# Patient Record
Sex: Female | Born: 1960 | Race: White | Hispanic: No | Marital: Single | Smoking: Never smoker
Health system: Southern US, Community
[De-identification: ages and names within clinical notes are randomized; demographics above are authoritative.]

## PROBLEM LIST (undated history)

## (undated) DIAGNOSIS — I10 Essential (primary) hypertension: Secondary | ICD-10-CM

## (undated) DIAGNOSIS — J4550 Severe persistent asthma, uncomplicated: Secondary | ICD-10-CM

## (undated) DIAGNOSIS — J309 Allergic rhinitis, unspecified: Secondary | ICD-10-CM

## (undated) DIAGNOSIS — E7800 Pure hypercholesterolemia, unspecified: Secondary | ICD-10-CM

## (undated) DIAGNOSIS — G4733 Obstructive sleep apnea (adult) (pediatric): Secondary | ICD-10-CM

## (undated) DIAGNOSIS — Z8616 Personal history of COVID-19: Secondary | ICD-10-CM

## (undated) DIAGNOSIS — J40 Bronchitis, not specified as acute or chronic: Secondary | ICD-10-CM

## (undated) DIAGNOSIS — R490 Dysphonia: Secondary | ICD-10-CM

## (undated) DIAGNOSIS — E662 Morbid (severe) obesity with alveolar hypoventilation: Secondary | ICD-10-CM

## (undated) HISTORY — DX: Obstructive sleep apnea (adult) (pediatric): G47.33

## (undated) HISTORY — DX: Morbid (severe) obesity with alveolar hypoventilation: E66.2

## (undated) HISTORY — DX: Severe persistent asthma, uncomplicated: J45.50

## (undated) HISTORY — DX: Personal history of COVID-19: Z86.16

## (undated) HISTORY — DX: Allergic rhinitis, unspecified: J30.9

## (undated) HISTORY — DX: Dysphonia: R49.0

## (undated) LAB — ECHOCARDIOGRAM COMPLETE

---

## 2020-06-02 ENCOUNTER — Other Ambulatory Visit: Payer: Self-pay

## 2020-06-02 ENCOUNTER — Emergency Department (HOSPITAL_COMMUNITY)
Admission: EM | Admit: 2020-06-02 | Discharge: 2020-06-02 | Disposition: A | Discharge status: Home / Self Care | Payer: Medicaid - Out of State | Attending: Emergency Medicine | Admitting: Emergency Medicine

## 2020-06-02 ENCOUNTER — Encounter (HOSPITAL_COMMUNITY): Payer: Self-pay | Admitting: Emergency Medicine

## 2020-06-02 ENCOUNTER — Emergency Department (HOSPITAL_COMMUNITY): Payer: Medicaid - Out of State

## 2020-06-02 DIAGNOSIS — J209 Acute bronchitis, unspecified: Secondary | ICD-10-CM | POA: Diagnosis not present

## 2020-06-02 DIAGNOSIS — Z20822 Contact with and (suspected) exposure to covid-19: Secondary | ICD-10-CM | POA: Diagnosis not present

## 2020-06-02 DIAGNOSIS — W182XXA Fall in (into) shower or empty bathtub, initial encounter: Secondary | ICD-10-CM | POA: Insufficient documentation

## 2020-06-02 DIAGNOSIS — M25512 Pain in left shoulder: Secondary | ICD-10-CM | POA: Diagnosis not present

## 2020-06-02 DIAGNOSIS — M545 Low back pain, unspecified: Secondary | ICD-10-CM

## 2020-06-02 DIAGNOSIS — W19XXXA Unspecified fall, initial encounter: Secondary | ICD-10-CM

## 2020-06-02 DIAGNOSIS — Y92012 Bathroom of single-family (private) house as the place of occurrence of the external cause: Secondary | ICD-10-CM | POA: Diagnosis not present

## 2020-06-02 DIAGNOSIS — R0602 Shortness of breath: Secondary | ICD-10-CM | POA: Diagnosis present

## 2020-06-02 HISTORY — DX: Pure hypercholesterolemia, unspecified: E78.00

## 2020-06-02 HISTORY — DX: Essential (primary) hypertension: I10

## 2020-06-02 HISTORY — DX: Bronchitis, not specified as acute or chronic: J40

## 2020-06-02 LAB — CBC
HCT: 46.9 % — ABNORMAL HIGH (ref 36.0–46.0)
Hemoglobin: 13.9 g/dL (ref 12.0–15.0)
MCH: 29.7 pg (ref 26.0–34.0)
MCHC: 29.6 g/dL — ABNORMAL LOW (ref 30.0–36.0)
MCV: 100.2 fL — ABNORMAL HIGH (ref 80.0–100.0)
Platelets: 282 10*3/uL (ref 150–400)
RBC: 4.68 MIL/uL (ref 3.87–5.11)
RDW: 14.2 % (ref 11.5–15.5)
WBC: 10.3 10*3/uL (ref 4.0–10.5)
nRBC: 0 % (ref 0.0–0.2)

## 2020-06-02 LAB — RESPIRATORY PANEL BY RT PCR (FLU A&B, COVID)
Influenza A by PCR: NEGATIVE
Influenza B by PCR: NEGATIVE
SARS Coronavirus 2 by RT PCR: NEGATIVE

## 2020-06-02 LAB — I-STAT CHEM 8, ED
BUN: 29 mg/dL — ABNORMAL HIGH (ref 6–20)
Calcium, Ion: 1.18 mmol/L (ref 1.15–1.40)
Chloride: 104 mmol/L (ref 98–111)
Creatinine, Ser: 1.2 mg/dL — ABNORMAL HIGH (ref 0.44–1.00)
Glucose, Bld: 104 mg/dL — ABNORMAL HIGH (ref 70–99)
HCT: 44 % (ref 36.0–46.0)
Hemoglobin: 15 g/dL (ref 12.0–15.0)
Potassium: 5.3 mmol/L — ABNORMAL HIGH (ref 3.5–5.1)
Sodium: 143 mmol/L (ref 135–145)
TCO2: 29 mmol/L (ref 22–32)

## 2020-06-02 LAB — BASIC METABOLIC PANEL
Anion gap: 9 (ref 5–15)
BUN: 21 mg/dL — ABNORMAL HIGH (ref 6–20)
CO2: 28 mmol/L (ref 22–32)
Calcium: 9.3 mg/dL (ref 8.9–10.3)
Chloride: 104 mmol/L (ref 98–111)
Creatinine, Ser: 1.12 mg/dL — ABNORMAL HIGH (ref 0.44–1.00)
GFR, Estimated: 56 mL/min — ABNORMAL LOW (ref >60)
Glucose, Bld: 170 mg/dL — ABNORMAL HIGH (ref 70–99)
Potassium: 5.6 mmol/L — ABNORMAL HIGH (ref 3.5–5.1)
Sodium: 141 mmol/L (ref 135–145)

## 2020-06-02 LAB — TROPONIN I (HIGH SENSITIVITY): Troponin I (High Sensitivity): 11 ng/L (ref <18)

## 2020-06-02 MED ORDER — AZITHROMYCIN 250 MG PO TABS: 250.0000 mg | ORAL_TABLET | Freq: Every day | ORAL | 0 refills | Status: DC

## 2020-06-02 MED ORDER — PREDNISONE 50 MG PO TABS: 50.0000 mg | ORAL_TABLET | Freq: Every day | ORAL | 0 refills | Status: DC

## 2020-06-02 MED ORDER — CYCLOBENZAPRINE HCL 10 MG PO TABS: 10.0000 mg | ORAL_TABLET | Freq: Three times a day (TID) | ORAL | 0 refills | Status: DC | PRN

## 2020-06-02 MED ORDER — IPRATROPIUM BROMIDE 0.02 % IN SOLN
0.5000 mg | Freq: Once | RESPIRATORY_TRACT | Status: AC
  Administered 2020-06-02: 0.5 mg via RESPIRATORY_TRACT
  Filled 2020-06-02: qty 2.5

## 2020-06-02 MED ORDER — ALBUTEROL SULFATE (2.5 MG/3ML) 0.083% IN NEBU
2.5000 mg | INHALATION_SOLUTION | Freq: Once | RESPIRATORY_TRACT | Status: AC
  Administered 2020-06-02: 2.5 mg via RESPIRATORY_TRACT
  Filled 2020-06-02: qty 3

## 2020-06-02 MED ORDER — SODIUM ZIRCONIUM CYCLOSILICATE 5 G PO PACK
5.0000 g | PACK | Freq: Once | ORAL | Status: AC
  Administered 2020-06-02: 5 g via ORAL
  Filled 2020-06-02 (×3): qty 1

## 2020-06-02 NOTE — Discharge Instructions (Signed)
You were in the emergency department for evaluation of cough increased shortness of breath along with evaluation of increased left shoulder and low back pain after a fall.  Your chest x-ray did not show any obvious pneumonia.  You did not want x-rays of your shoulder and back.  We are prescribing you some antibiotics and steroids for your breathing and some muscle relaxant for your back pain.  Please follow-up with your regular doctor.  Return to the emergency department if any worsening or concerning symptoms.

## 2020-06-02 NOTE — ED Provider Notes (Signed)
[facility] EMERGENCY DEPARTMENT Provider Note   CSN: [id number] Arrival date & time: 06/02/20  1307     History Chief Complaint  Patient presents with  . Shortness of Breath  . Fall    Julia Henderson is a 59 y.o. female.  She has a history of chronic bronchitis and she states every time she gets a cold it goes into her chest.  She had a cold and now she is having cough with green phlegm and increased shortness of breath.  No known fevers.  She is not Covid vaccinated.  She is also complaining of worsening pain in her back and left shoulder after she slipped and fell in the shower.  She has chronic shoulder and back problems and used to follow with the pain specialist though currently does not have one.  No numbness or weakness.  The history is provided by the patient.  Shortness of Breath Severity:  Moderate Onset quality:  Gradual Duration:  1 week Timing:  Constant Progression:  Worsening Chronicity:  Recurrent Context: activity   Relieved by:  Nothing Worsened by:  Activity and coughing Ineffective treatments:  None tried Associated symptoms: cough and sputum production   Associated symptoms: no abdominal pain, no chest pain, no fever, no headaches, no hemoptysis, no rash, no sore throat and no syncope   Fall Associated symptoms include shortness of breath. Pertinent negatives include no chest pain, no abdominal pain and no headaches.       Past Medical History:  Diagnosis Date  . Bronchitis   . High cholesterol   . Hypertension     There are no problems to display for this patient.   Past Surgical History:  Procedure Laterality Date  . CESAREAN SECTION       OB History   No obstetric history on file.     No family history on file.  Social History   Tobacco Use  . Smoking status: Never Smoker  . Smokeless tobacco: Never Used  Substance Use Topics  . Alcohol use: Yes  . Drug use: Not Currently    Home Medications Prior to  Admission medications   Not on File    Allergies    Patient has no allergy information on record.  Review of Systems   Review of Systems  Constitutional: Negative for fever.  HENT: Negative for sore throat.   Eyes: Negative for visual disturbance.  Respiratory: Positive for cough, sputum production and shortness of breath. Negative for hemoptysis.   Cardiovascular: Negative for chest pain and syncope.  Gastrointestinal: Negative for abdominal pain.  Genitourinary: Negative for dysuria.  Musculoskeletal: Positive for back pain.  Skin: Negative for rash.  Neurological: Negative for headaches.    Physical Exam Updated Vital Signs BP 119/75 (BP Location: Right Wrist)   Pulse (!) 108   Temp 98 F (36.7 C) (Oral)   Resp (!) 22   Ht 5\' 1"  (1.549 m)   Wt 113.4 kg   SpO2 94%   BMI 47.24 kg/m   Physical Exam Vitals and nursing note reviewed.  Constitutional:      General: She is not in acute distress.    Appearance: Normal appearance. She is well-developed.  HENT:     Head: Normocephalic and atraumatic.  Eyes:     Conjunctiva/sclera: Conjunctivae normal.  Cardiovascular:     Rate and Rhythm: Regular rhythm. Tachycardia present.     Heart sounds: No murmur heard.   Pulmonary:     Effort: Pulmonary effort  is normal. Tachypnea present. No respiratory distress.     Breath sounds: Normal breath sounds.  Abdominal:     Palpations: Abdomen is soft.     Tenderness: There is no abdominal tenderness.  Musculoskeletal:        General: Normal range of motion.     Cervical back: Neck supple.     Right lower leg: No edema.     Left lower leg: No edema.     Comments: She has tenderness anterior shoulder.  She has normal internal and external rotation.  Pain with active movement.  She has diffuse thoracic and lumbar spine tenderness.  She says that this is baseline for her may be slightly worse since the fall.  Skin:    General: Skin is warm and dry.     Capillary Refill: Capillary  refill takes less than 2 seconds.  Neurological:     General: No focal deficit present.     Mental Status: She is alert.     Gait: Gait normal.     ED Results / Procedures / Treatments   Labs (all labs ordered are listed, but only abnormal results are displayed) Labs Reviewed  CBC - Abnormal; Notable for the following components:      Result Value   HCT 46.9 (*)    MCV 100.2 (*)    MCHC 29.6 (*)    All other components within normal limits  BASIC METABOLIC PANEL - Abnormal; Notable for the following components:   Potassium 5.6 (*)    Glucose, Bld 170 (*)    BUN 21 (*)    Creatinine, Ser 1.12 (*)    GFR, Estimated 56 (*)    All other components within normal limits  I-STAT CHEM 8, ED - Abnormal; Notable for the following components:   Potassium 5.3 (*)    BUN 29 (*)    Creatinine, Ser 1.20 (*)    Glucose, Bld 104 (*)    All other components within normal limits  RESPIRATORY PANEL BY RT PCR (FLU A&B, COVID)  TROPONIN I (HIGH SENSITIVITY)    EKG EKG Interpretation  Date/Time:  Sunday June 02 2020 13:16:25 EST Ventricular Rate:  113 PR Interval:  162 QRS Duration: 68 QT Interval:  320 QTC Calculation: 438 R Axis:   94 Text Interpretation: Sinus tachycardia with frequent Premature ventricular complexes Rightward axis Possible Anterior infarct , age undetermined Abnormal ECG No old tracing to compare Confirmed by Meridee Score [id number]) on 06/02/2020 3:42:57 PM   Radiology DG Chest Portable 1 View  Result Date: 06/02/2020 CLINICAL DATA:  Shortness of breath and cough.  Chest pain. EXAM: PORTABLE CHEST 1 VIEW COMPARISON:  None. FINDINGS: Lungs are clear. Heart is enlarged with pulmonary vascularity normal. No adenopathy. No bone lesions. IMPRESSION: Lungs clear.  Cardiomegaly.  No adenopathy. Electronically Signed   By: Bretta Bang III M.D.   On: 06/02/2020 14:05    Procedures Procedures (including critical care time)  Medications Ordered in  ED Medications  albuterol (PROVENTIL) (2.5 MG/3ML) 0.083% nebulizer solution 2.5 mg (2.5 mg Nebulization Given 06/02/20 1711)  ipratropium (ATROVENT) nebulizer solution 0.5 mg (0.5 mg Nebulization Given 06/02/20 1711)  sodium zirconium cyclosilicate (LOKELMA) packet 5 g (5 g Oral Given 06/02/20 2002)    ED Course  I have reviewed the triage vital signs and the nursing notes.  Pertinent labs & imaging results that were available during my care of the patient were reviewed by me and considered in my medical decision making (see  chart for details).  Clinical Course as of Jun 03 1036  Wynelle Link Jun 02, 2020  1611 I offered the patient a lumbar spine and shoulder x-ray which she declined.  She said she did not feel like anything was broken.   [MB]    Clinical Course User Index [MB] Terrilee Files, MD   MDM Rules/Calculators/A&P                         This patient complains of cough, sob, back pain, left shoulder pain; this involves an extensive number of treatment Options and is a complaint that carries with it a high risk of complications and Morbidity. The differential includes COPD, bronchitis, pneumonia, Covid, musculoskeletal pain, fracture, dislocation  I ordered, reviewed and interpreted labs, which included CBC with normal white count normal hemoglobin, chemistries with elevated potassium, repeated and mildly elevated, Covid testing negative, troponin unremarkable I ordered medication albuterol and Atrovent neb, Lokelma for elevated potassium I ordered imaging studies which included chest x-ray and I independently    visualized and interpreted imaging which showed no acute infiltrates Previous records obtained and reviewed in epic, no recent visits  After the interventions stated above, I reevaluated the patient and found patient to be hemodynamically stable.  Still dyspneic and slightly tachycardic.  She is comfortable plan for antibiotics and steroids for her exacerbation of her  chronic bronchitis along with a muscle relaxant.  Recommend that she get a PCP for further management of her symptoms.  Return instructions discussed.   Final Clinical Impression(s) / ED Diagnoses Final diagnoses:  Acute bronchitis, unspecified organism  Fall, initial encounter  Low back pain, unspecified back pain laterality, unspecified chronicity, unspecified whether sciatica present  Left shoulder pain, unspecified chronicity    Rx / DC Orders ED Discharge Orders         Ordered    predniSONE (DELTASONE) 50 MG tablet  Daily        06/02/20 1734    azithromycin (ZITHROMAX) 250 MG tablet  Daily        06/02/20 1734    cyclobenzaprine (FLEXERIL) 10 MG tablet  3 times daily PRN        06/02/20 1734           Terrilee Files, MD 06/03/20 1042

## 2020-06-02 NOTE — ED Notes (Signed)
Called pharmacy, will send another Lokelma dose to floor.  Updated pt.

## 2020-06-02 NOTE — ED Triage Notes (Addendum)
Pt reports productive cough with green phlegm and SOB at rest.  States she has a history of bronchitis.  Also slipped and fell in shower yesterday.  Reports lower back pain and L shoulder pain.

## 2020-06-16 ENCOUNTER — Encounter (HOSPITAL_COMMUNITY): Payer: Self-pay

## 2020-06-16 ENCOUNTER — Emergency Department (HOSPITAL_COMMUNITY): Payer: Medicaid - Out of State

## 2020-06-16 ENCOUNTER — Emergency Department (HOSPITAL_COMMUNITY)
Admission: EM | Admit: 2020-06-16 | Discharge: 2020-06-16 | Disposition: A | Discharge status: Home / Self Care | Payer: Medicaid - Out of State | Attending: Emergency Medicine | Admitting: Emergency Medicine

## 2020-06-16 ENCOUNTER — Other Ambulatory Visit: Payer: Self-pay

## 2020-06-16 DIAGNOSIS — W06XXXA Fall from bed, initial encounter: Secondary | ICD-10-CM | POA: Insufficient documentation

## 2020-06-16 DIAGNOSIS — M545 Low back pain, unspecified: Secondary | ICD-10-CM | POA: Diagnosis not present

## 2020-06-16 DIAGNOSIS — I10 Essential (primary) hypertension: Secondary | ICD-10-CM | POA: Insufficient documentation

## 2020-06-16 DIAGNOSIS — M25512 Pain in left shoulder: Secondary | ICD-10-CM | POA: Diagnosis present

## 2020-06-16 DIAGNOSIS — W19XXXA Unspecified fall, initial encounter: Secondary | ICD-10-CM

## 2020-06-16 MED ORDER — HYDROCODONE-ACETAMINOPHEN 5-325 MG PO TABS
1.0000 | ORAL_TABLET | Freq: Once | ORAL | Status: AC
  Administered 2020-06-16: 1 via ORAL
  Filled 2020-06-16: qty 1

## 2020-06-16 NOTE — ED Triage Notes (Signed)
Patient states that she fell this am while getting out of bed, states that she thinks her leg gave way. Complains of left shoulder and lower back pain after hitting dresser. No loc. Alert and oriented

## 2020-06-16 NOTE — Discharge Instructions (Signed)
Please read and follow all provided instructions.  Your diagnoses today include:  1. Acute pain of left shoulder   2. Acute bilateral low back pain without sciatica   3. Fall, initial encounter     Tests performed today include:  X-rays of the affected areas  Vital signs. See below for your results today.   Home care instructions:   Follow any educational materials contained in this packet  Follow R.I.C.E. Protocol:  R - rest your injury   I  - use ice on injury without applying directly to skin  C - compress injury with bandage or splint  E - elevate the injury as much as possible  Follow-up instructions: Please follow-up with your primary care provider or the provided orthopedic physician (bone specialist) if you continue to have significant pain in 1 week. In this case you may have a more severe injury that requires further care.   Return instructions:   Please return if your fingers are numb or tingling, appear gray or blue, or you have severe pain (also elevate the arm and loosen splint or wrap if you were given one)  Please return to the Emergency Department if you experience worsening symptoms.   Please return if you have any other emergent concerns.  Additional Information:  Your vital signs today were: BP 107/62 (BP Location: Right Arm)   Pulse 86   Temp 98.2 F (36.8 C) (Oral)   SpO2 93%  If your blood pressure (BP) was elevated above 135/85 this visit, please have this repeated by your doctor within one month. --------------

## 2020-06-16 NOTE — ED Provider Notes (Signed)
4:07 PM Care of the patient assumed at the change of shift pending xrays which were negative for fracture. Patient will be discharged home, recommend rest, Motrin/APAP for pain and PCP follow up.    Pollyann Savoy, MD 06/16/20 [phone]

## 2020-06-16 NOTE — ED Provider Notes (Signed)
[facility] EMERGENCY DEPARTMENT Provider Note   CSN: [id number] Arrival date & time: 06/16/20  1325     History No chief complaint on file.   Julia Henderson is a 59 y.o. female.  Patient presents to the emergency department today for evaluation of left shoulder and lower back pain after a fall this morning.  Patient states that she fell while trying to get out of bed.  No preceding lightheadedness, chest pain or shortness of breath.  She fell against a Child psychotherapist.  She states that after she fell she was able to get up and get back in the bed.  She did not hit her head or lose consciousness.  She took gabapentin that she has at home.  Pain persisted prompting emergency department visit.  Patient denies numbness or tingling in the upper or lower extremities.  Pain in the left shoulder is worse with movement.  She is unable to reach behind her back or raise her arm above shoulder level.  Patient has a history of chronic back pain and she feels that this aggravated the pain in her lower back, which is also worse with movement.          Past Medical History:  Diagnosis Date  . Bronchitis   . High cholesterol   . Hypertension     There are no problems to display for this patient.   Past Surgical History:  Procedure Laterality Date  . CESAREAN SECTION       OB History   No obstetric history on file.     No family history on file.  Social History   Tobacco Use  . Smoking status: Never Smoker  . Smokeless tobacco: Never Used  Substance Use Topics  . Alcohol use: Yes  . Drug use: Not Currently    Home Medications Prior to Admission medications   Medication Sig Start Date End Date Taking? Authorizing Provider  azithromycin (ZITHROMAX) 250 MG tablet Take 1 tablet (250 mg total) by mouth daily. Take first 2 tablets together, then 1 every day until finished. 06/02/20   Terrilee Files, MD  cyclobenzaprine (FLEXERIL) 10 MG tablet Take 1 tablet (10 mg total)  by mouth 3 (three) times daily as needed for muscle spasms. 06/02/20   Terrilee Files, MD  predniSONE (DELTASONE) 50 MG tablet Take 1 tablet (50 mg total) by mouth daily. 06/02/20   Terrilee Files, MD    Allergies    Patient has no known allergies.  Review of Systems   Review of Systems  Constitutional: Negative for fever and unexpected weight change.  Gastrointestinal: Negative for constipation.       Negative for fecal incontinence.   Genitourinary: Negative for dysuria, flank pain and hematuria.       Negative for urinary incontinence or retention.  Musculoskeletal: Positive for arthralgias and back pain.  Neurological: Negative for weakness and numbness.       Denies saddle paresthesias.    Physical Exam Updated Vital Signs BP 107/62 (BP Location: Right Arm)   Pulse 86   Temp 98.2 F (36.8 C) (Oral)   SpO2 93%   Physical Exam Vitals and nursing note reviewed.  Constitutional:      Appearance: She is well-developed.  HENT:     Head: Normocephalic and atraumatic.  Eyes:     Conjunctiva/sclera: Conjunctivae normal.  Pulmonary:     Effort: Pulmonary effort is normal.  Abdominal:     Palpations: Abdomen is soft.  Tenderness: There is no abdominal tenderness.  Musculoskeletal:     Left shoulder: Tenderness (anterior and posterior) present. No swelling or laceration. Decreased range of motion.     Left elbow: Normal range of motion. No tenderness.     Cervical back: Normal range of motion and neck supple. No swelling or tenderness.     Lumbar back: Tenderness present. No bony tenderness.     Comments: No step-off noted with palpation of spine.   Skin:    General: Skin is warm and dry.     Findings: No rash.  Neurological:     Mental Status: She is alert.     Sensory: No sensory deficit.     Deep Tendon Reflexes: Reflexes are normal and symmetric.     Comments: 5/5 strength in entire lower extremities bilaterally. No sensation deficit.  Patient able to pivot  from wheelchair to bed without any significant difficulties.  She is able to raise her arm up to take off a purse that was around her neck.     ED Results / Procedures / Treatments   Labs (all labs ordered are listed, but only abnormal results are displayed) Labs Reviewed - No data to display  EKG None  Radiology No results found.  Procedures Procedures (including critical care time)  Medications Ordered in ED Medications  HYDROcodone-acetaminophen (NORCO/VICODIN) 5-325 MG per tablet 1 tablet (has no administration in time range)    ED Course  I have reviewed the triage vital signs and the nursing notes.  Pertinent labs & imaging results that were available during my care of the patient were reviewed by me and considered in my medical decision making (see chart for details).  Patient seen and examined. Work-up initiated. Medications ordered -- she is not driving.   Vital signs reviewed and are as follows: Vitals:   06/16/20 1334  BP: 107/62  Pulse: 86  Temp: 98.2 F (36.8 C)  SpO2: 93%   3:10 PM Signout to oncoming shift. Patient pending x-rays of lumbar spine and left shoulder.      MDM Rules/Calculators/A&P                          Pending x-rays.   Final Clinical Impression(s) / ED Diagnoses Final diagnoses:  Acute pain of left shoulder  Acute bilateral low back pain without sciatica  Fall, initial encounter    Rx / DC Orders ED Discharge Orders    None       Renne Crigler, Cordelia Poche 06/16/20 1535    Jacalyn Lefevre, MD 06/16/20 1553

## 2020-08-23 DIAGNOSIS — R0602 Shortness of breath: Secondary | ICD-10-CM

## 2020-09-13 ENCOUNTER — Emergency Department (HOSPITAL_COMMUNITY)
Admission: EM | Admit: 2020-09-13 | Discharge: 2020-09-14 | Disposition: A | Discharge status: Home / Self Care | Payer: Medicaid - Out of State | Attending: Emergency Medicine | Admitting: Emergency Medicine

## 2020-09-13 ENCOUNTER — Encounter (HOSPITAL_COMMUNITY): Payer: Self-pay | Admitting: *Deleted

## 2020-09-13 ENCOUNTER — Other Ambulatory Visit: Payer: Self-pay

## 2020-09-13 ENCOUNTER — Emergency Department (HOSPITAL_COMMUNITY): Payer: Medicaid - Out of State

## 2020-09-13 DIAGNOSIS — R000 Tachycardia, unspecified: Secondary | ICD-10-CM | POA: Insufficient documentation

## 2020-09-13 DIAGNOSIS — R609 Edema, unspecified: Secondary | ICD-10-CM | POA: Diagnosis not present

## 2020-09-13 DIAGNOSIS — Z8616 Personal history of COVID-19: Secondary | ICD-10-CM | POA: Insufficient documentation

## 2020-09-13 DIAGNOSIS — R0602 Shortness of breath: Secondary | ICD-10-CM | POA: Diagnosis not present

## 2020-09-13 DIAGNOSIS — Z8709 Personal history of other diseases of the respiratory system: Secondary | ICD-10-CM | POA: Diagnosis not present

## 2020-09-13 DIAGNOSIS — Z79899 Other long term (current) drug therapy: Secondary | ICD-10-CM | POA: Diagnosis not present

## 2020-09-13 DIAGNOSIS — I10 Essential (primary) hypertension: Secondary | ICD-10-CM | POA: Diagnosis not present

## 2020-09-13 DIAGNOSIS — R519 Headache, unspecified: Secondary | ICD-10-CM | POA: Diagnosis not present

## 2020-09-13 LAB — CBC WITH DIFFERENTIAL/PLATELET
Abs Immature Granulocytes: 0.07 10*3/uL (ref 0.00–0.07)
Basophils Absolute: 0 10*3/uL (ref 0.0–0.1)
Basophils Relative: 1 %
Eosinophils Absolute: 0.2 10*3/uL (ref 0.0–0.5)
Eosinophils Relative: 4 %
HCT: 46.2 % — ABNORMAL HIGH (ref 36.0–46.0)
Hemoglobin: 13.6 g/dL (ref 12.0–15.0)
Immature Granulocytes: 1 %
Lymphocytes Relative: 24 %
Lymphs Abs: 1.3 10*3/uL (ref 0.7–4.0)
MCH: 27.8 pg (ref 26.0–34.0)
MCHC: 29.4 g/dL — ABNORMAL LOW (ref 30.0–36.0)
MCV: 94.5 fL (ref 80.0–100.0)
Monocytes Absolute: 0.6 10*3/uL (ref 0.1–1.0)
Monocytes Relative: 11 %
Neutro Abs: 3.3 10*3/uL (ref 1.7–7.7)
Neutrophils Relative %: 59 %
Platelets: 175 10*3/uL (ref 150–400)
RBC: 4.89 MIL/uL (ref 3.87–5.11)
RDW: 14.9 % (ref 11.5–15.5)
WBC: 5.6 10*3/uL (ref 4.0–10.5)
nRBC: 0.4 % — ABNORMAL HIGH (ref 0.0–0.2)

## 2020-09-13 LAB — COMPREHENSIVE METABOLIC PANEL
ALT: 47 U/L — ABNORMAL HIGH (ref 0–44)
AST: 40 U/L (ref 15–41)
Albumin: 3.2 g/dL — ABNORMAL LOW (ref 3.5–5.0)
Alkaline Phosphatase: 69 U/L (ref 38–126)
Anion gap: 12 (ref 5–15)
BUN: 6 mg/dL — ABNORMAL LOW (ref 8–23)
CO2: 28 mmol/L (ref 22–32)
Calcium: 9.6 mg/dL (ref 8.9–10.3)
Chloride: 99 mmol/L (ref 98–111)
Creatinine, Ser: 0.87 mg/dL (ref 0.44–1.00)
GFR, Estimated: >60 mL/min (ref >60)
Glucose, Bld: 363 mg/dL — ABNORMAL HIGH (ref 70–99)
Potassium: 4.3 mmol/L (ref 3.5–5.1)
Sodium: 139 mmol/L (ref 135–145)
Total Bilirubin: 0.7 mg/dL (ref 0.3–1.2)
Total Protein: 6.6 g/dL (ref 6.5–8.1)

## 2020-09-13 LAB — D-DIMER, QUANTITATIVE: D-Dimer, Quant: 0.97 ug/mL-FEU — ABNORMAL HIGH (ref 0.00–0.50)

## 2020-09-13 MED ORDER — SODIUM CHLORIDE 0.9 % IV BOLUS: 1000.0000 mL | Freq: Once | INTRAVENOUS | Status: DC

## 2020-09-13 MED ORDER — ALBUTEROL SULFATE HFA 108 (90 BASE) MCG/ACT IN AERS
2.0000 | INHALATION_SPRAY | Freq: Once | RESPIRATORY_TRACT | Status: AC
  Administered 2020-09-13: 2 via RESPIRATORY_TRACT
  Filled 2020-09-13: qty 6.7

## 2020-09-13 MED ORDER — AEROCHAMBER PLUS FLO-VU LARGE MISC
1.0000 | Freq: Once | Status: AC
  Administered 2020-09-13: 1

## 2020-09-13 MED ORDER — METHYLPREDNISOLONE SODIUM SUCC 125 MG IJ SOLR
125.0000 mg | Freq: Once | INTRAMUSCULAR | Status: AC
  Administered 2020-09-13: 125 mg via INTRAVENOUS
  Filled 2020-09-13: qty 2

## 2020-09-13 NOTE — ED Notes (Signed)
Patient reports wearing 4L [place] at baseline, patient took O2 off and is maintaining 93-95% on room air.

## 2020-09-13 NOTE — ED Triage Notes (Signed)
Patient c/o sob onset 1 week ago states she has been coughing up green colored sputum. Patient is on home o2 at 4 liter/Julia Henderson

## 2020-09-14 MED ORDER — LISINOPRIL 20 MG PO TABS: 20.0000 mg | ORAL_TABLET | Freq: Every day | ORAL | 0 refills | Status: DC

## 2020-09-14 MED ORDER — ALBUTEROL SULFATE HFA 108 (90 BASE) MCG/ACT IN AERS: 2.0000 | INHALATION_SPRAY | RESPIRATORY_TRACT | 0 refills | Status: DC | PRN

## 2020-09-14 MED ORDER — PREDNISONE 50 MG PO TABS: 50.0000 mg | ORAL_TABLET | Freq: Every day | ORAL | 0 refills | Status: AC

## 2020-09-14 MED ORDER — PREDNISONE 50 MG PO TABS: 50.0000 mg | ORAL_TABLET | Freq: Every day | ORAL | 0 refills | Status: DC

## 2020-09-14 MED ORDER — BENZONATATE 100 MG PO CAPS: 100.0000 mg | ORAL_CAPSULE | Freq: Three times a day (TID) | ORAL | 0 refills | Status: DC | PRN

## 2020-09-14 MED ORDER — LISINOPRIL 20 MG PO TABS: 20.0000 mg | ORAL_TABLET | Freq: Once | ORAL | Status: DC

## 2020-09-14 NOTE — Discharge Instructions (Addendum)
He came to the emergency department today to be evaluated for your shortness of breath.  You were given albuterol and Solu-Medrol to help with your wheezing and shortness of breath.  There is concern that you may have a blood clot in your lungs due to your recent COVID-19 exposure and persistent fast heart rate.  The recommendation was to receive a CTA of your chest to evaluate for a blood clot.  You have refused this procedure.  If you change your mind or develop worsening shortness of breath or other concerning symptoms please return to the emergency department at any time to receive further care.  Please continue to use your albuterol inhaler every 4-6 hours as needed for shortness of breath.  I have given you a prescription of prednisone please take 1 pill once a day for the next 5 days.  I have also given you Tessalon you may use 1 pill every 8 hours as needed for cough.    These follow-up with your primary care doctor as soon as possible.  While in hospital you were noted to have a high blood pressure.  Because you are out of your medication I have given you a prescription for the next 2 weeks.  Please discuss your blood pressure management when you next see your primary care provider.  These return to the emergency department if:  Your shortness of breath gets worse. You have shortness of breath when you are resting. You feel light-headed or you faint. You have a cough that is not controlled with medicines. You cough up blood. You have pain with breathing. You have pain in your chest, arms, shoulders, or abdomen. You have a fever. You cannot walk up stairs or exercise the way that you normally do.

## 2020-09-14 NOTE — ED Provider Notes (Signed)
[facility] EMERGENCY DEPARTMENT Provider Note   CSN: [id number] Arrival date & time: 09/13/20  1323     History Chief Complaint  Patient presents with  . Shortness of Breath    Julia Henderson is a 60 y.o. female past medical history of hypertension, hyperlipidemia and bronchitis.  Patient presents with a chief complaint of shortness of breath, wheezing, and productive cough.  Patient reports he has had the symptoms for the last week and a half.  Patient reports that her symptoms have been worsening over this time.  She reports that her cough is producing green sputum.  Patient reports that her symptoms are worse with exertion, no alleviating factors.  Denies any history of asthma or COPD.  Patient endorses bilateral lower leg swelling.  Patient denies any fevers, chills, chest pain, hemoptysis, vomiting, nausea, vomiting, lightheadedness, dizziness, syncopal episodes.    Patient denies any history of DVT or PE, recent surgery, cancer treatment in the last 6 months, prolonged immobilization, hormone use, unilateral leg swelling or tenderness.  Patient reports over the last week and a half she has had remittent headaches.  These headaches are gradual in onset.  It is not maximal at onset.  No aggravating factors.    She reports that she had COVID-19 6 weeks prior, was admitted to the hospital, and was discharged on 4 L of O2 via nasal cannula.  Patient reports that she received dexamethasone in the emergency department but denies any other treatment for COVID-19.  Patient reports that she is unvaccinated for COVID-19.  HPI     Past Medical History:  Diagnosis Date  . Bronchitis   . High cholesterol   . Hypertension     There are no problems to display for this patient.   Past Surgical History:  Procedure Laterality Date  . CESAREAN SECTION       OB History   No obstetric history on file.     No family history on file.  Social History   Tobacco Use   . Smoking status: Never Smoker  . Smokeless tobacco: Never Used  Substance Use Topics  . Alcohol use: Yes  . Drug use: Not Currently    Home Medications Prior to Admission medications   Medication Sig Start Date End Date Taking? Authorizing Provider  albuterol (VENTOLIN HFA) 108 (90 Base) MCG/ACT inhaler Inhale 2 puffs into the lungs every 4 (four) hours as needed for wheezing or shortness of breath. 09/14/20   Haskel Schroeder, PA-C  azithromycin (ZITHROMAX) 250 MG tablet Take 1 tablet (250 mg total) by mouth daily. Take first 2 tablets together, then 1 every day until finished. 06/02/20   Terrilee Files, MD  benzonatate (TESSALON) 100 MG capsule Take 1 capsule (100 mg total) by mouth every 8 (eight) hours as needed for cough. 09/14/20   Haskel Schroeder, PA-C  cyclobenzaprine (FLEXERIL) 10 MG tablet Take 1 tablet (10 mg total) by mouth 3 (three) times daily as needed for muscle spasms. 06/02/20   Terrilee Files, MD  lisinopril (ZESTRIL) 20 MG tablet Take 1 tablet (20 mg total) by mouth daily for 14 days. 09/14/20 09/28/20  Haskel Schroeder, PA-C  predniSONE (DELTASONE) 50 MG tablet Take 1 tablet (50 mg total) by mouth daily for 5 days. 09/14/20 09/19/20  Haskel Schroeder, PA-C    Allergies    Patient has no known allergies.  Review of Systems   Review of Systems  Constitutional: Negative for chills and fever.  Eyes: Negative for visual disturbance.  Respiratory: Positive for cough and shortness of breath.   Cardiovascular: Positive for leg swelling. Negative for chest pain.  Gastrointestinal: Negative for abdominal pain, nausea and vomiting.  Genitourinary: Negative for dysuria.  Musculoskeletal: Negative for back pain and neck pain.  Skin: Negative for color change and rash.  Neurological: Positive for headaches. Negative for dizziness, syncope and light-headedness.  Psychiatric/Behavioral: Negative for confusion.    Physical Exam Updated Vital Signs BP (!)  125/92   Pulse (!) 124   Temp 98.4 F (36.9 C) (Oral)   Resp 19   Ht 5\' 1"  (1.549 m)   Wt 113.4 kg   SpO2 99%   BMI 47.24 kg/m   Physical Exam Vitals and nursing note reviewed.  Constitutional:      General: She is not in acute distress.    Appearance: She is not ill-appearing, toxic-appearing or diaphoretic.     Interventions: Nasal cannula in place.  HENT:     Head: Normocephalic.  Eyes:     General: No scleral icterus.       Right eye: No discharge.        Left eye: No discharge.  Cardiovascular:     Rate and Rhythm: Tachycardia present.  Pulmonary:     Effort: Pulmonary effort is normal. No tachypnea, accessory muscle usage or respiratory distress.     Breath sounds: No stridor. Examination of the right-upper field reveals wheezing. Examination of the left-upper field reveals wheezing. Examination of the right-middle field reveals wheezing. Examination of the left-middle field reveals wheezing. Examination of the right-lower field reveals wheezing. Examination of the left-lower field reveals wheezing. Wheezing present. No decreased breath sounds, rhonchi or rales.     Comments: Patient speaking in shortened sentences Musculoskeletal:     Cervical back: Neck supple.     Right lower leg: No deformity, lacerations, tenderness or bony tenderness. 1+ Edema present.     Left lower leg: No deformity, lacerations, tenderness or bony tenderness. 1+ Edema present.  Skin:    General: Skin is warm and dry.  Neurological:     General: No focal deficit present.     Mental Status: She is alert.  Psychiatric:        Behavior: Behavior is cooperative.     ED Results / Procedures / Treatments   Labs (all labs ordered are listed, but only abnormal results are displayed) Labs Reviewed  CBC WITH DIFFERENTIAL/PLATELET - Abnormal; Notable for the following components:      Result Value   HCT 46.2 (*)    MCHC 29.4 (*)    nRBC 0.4 (*)    All other components within normal limits   COMPREHENSIVE METABOLIC PANEL - Abnormal; Notable for the following components:   Glucose, Bld 363 (*)    BUN 6 (*)    Albumin 3.2 (*)    ALT 47 (*)    All other components within normal limits  D-DIMER, QUANTITATIVE - Abnormal; Notable for the following components:   D-Dimer, Quant 0.97 (*)    All other components within normal limits    EKG EKG Interpretation  Date/Time:  Friday September 13 2020 14:00:17 EST Ventricular Rate:  119 PR Interval:  158 QRS Duration: 74 QT Interval:  304 QTC Calculation: 427 R Axis:   95 Text Interpretation: Sinus tachycardia Rightward axis Cannot rule out Anterior infarct , age undetermined Abnormal ECG Sinus tachycardia, no change from previous Confirmed by 01-03-2005 [phone]) on 09/13/2020 8:38:45 PM  Radiology DG Chest 2 View  Result Date: 09/13/2020 CLINICAL DATA:  60 year old female with history of shortness of breath and productive cough for the past 2 weeks. EXAM: CHEST - 2 VIEW COMPARISON:  Chest x-ray 08/25/2020. FINDINGS: Lung volumes are normal. No consolidative airspace disease. No pleural effusions. No pneumothorax. No pulmonary nodule or mass noted. Pulmonary vasculature and the cardiomediastinal silhouette are within normal limits. IMPRESSION: No radiographic evidence of acute cardiopulmonary disease. Electronically Signed   By: Trudie Reed M.D.   On: 09/13/2020 15:01    Procedures Procedures  Medications Ordered in ED Medications  sodium chloride 0.9 % bolus 1,000 mL (1,000 mLs Intravenous Not Given 09/13/20 2359)  albuterol (VENTOLIN HFA) 108 (90 Base) MCG/ACT inhaler 2 puff (2 puffs Inhalation Given 09/13/20 2215)  methylPREDNISolone sodium succinate (SOLU-MEDROL) 125 mg/2 mL injection 125 mg (125 mg Intravenous Given 09/13/20 2259)  AeroChamber Plus Flo-Vu Large MISC 1 each (1 each Other Given 09/13/20 2358)    ED Course  I have reviewed the triage vital signs and the nursing notes.  Pertinent labs & imaging results  that were available during my care of the patient were reviewed by me and considered in my medical decision making (see chart for details).    MDM Rules/Calculators/A&P                          Alert 60 year old female no acute distress, nontoxic appearing.  Patient noted to be on 4 L of oxygen via nasal cannula.  Patient is speaking in shortened sentences.  Presents with chief complaint of shortness of breath, wheezing, productive cough for the last week and a half.  Patient reports she tested positive for COVID-19 6 weeks prior.  Patient had not received any vaccinations prior to her infection.  Patient reports that she was hospitalized, received dexamethasone, and discharged on 4 L of oxygen via nasal cannula.  Patient denies any chest pain.  Wheezing noted in all lung fields.  Patient has +1 edema to bilateral lower extremities, no erythema or tenderness noted.  Patient is noted to be tachycardic with rates from 121-109.  Oxygen saturation noted to be 99% on 4 L of oxygen via nasal cannula.  Chest x-ray shows no acute cardiopulmonary disease.  KG shows sinus tachycardia.  CMP shows glucose elevated at 363, no signs of DKA as anion gap within normal limits and bicarb within normal limits.  CBC showed hematocrit slightly elevated at 46.2.  Did patient is wheezing albuterol and Solu-Medrol were ordered.  Patient's persistent tachycardia and recent COVID-19 infection concern for pulmonary embolism.  Discussed this concern with patient.  Patient refuses CTA chest at this time despite offers of antianxiety medication.  Will order D-dimer to help risk stratify.  On repeat examination patient's lungs clear to auscultation bilaterally.  Patient is noted to have oxygen saturation 95% or greater on room air.  Patient reports that her breathing feels much better.  Discussed positive D-dimer and continued concern for pulmonary embolism.  Patient continues to refuse CTA chest.  Explained to patient that  without this test we cannot rule out the presence of a pulmonary embolism, if a pulmonary embolism is present it could lead to worsening shortness of breath and/or death.  Patient expresses understanding of this and continues to refuse CTA chest at this time.  Due to this patient will be discharged AMA.  Patient was told that she could return to the emergency department at any time if  her shortness of breath worsened or she develop any new or concerning symptoms.  Patient was given prescription for Tessalon, 5-day course of prednisone, albuterol inhaler as bronchitis is on the differential and a possible cause of her symptoms.  Patient was noted to be hypertensive throughout her hospital stay.  She reports that she is currently out of her lisinopril medication and will not receive any more until she is seen by her primary care provider on March 8.  Due to this 2-week prescription of lisinopril was written.        Final Clinical Impression(s) / ED Diagnoses Final diagnoses:  SOB (shortness of breath)    Rx / DC Orders ED Discharge Orders         Ordered    benzonatate (TESSALON) 100 MG capsule  Every 8 hours PRN,   Status:  Discontinued        09/14/20 0004    albuterol (VENTOLIN HFA) 108 (90 Base) MCG/ACT inhaler  Every 4 hours PRN,   Status:  Discontinued        09/14/20 0004    lisinopril (ZESTRIL) 20 MG tablet  Daily,   Status:  Discontinued        09/14/20 0030    albuterol (VENTOLIN HFA) 108 (90 Base) MCG/ACT inhaler  Every 4 hours PRN        09/14/20 0039    benzonatate (TESSALON) 100 MG capsule  Every 8 hours PRN        09/14/20 0039    lisinopril (ZESTRIL) 20 MG tablet  Daily        09/14/20 0039    predniSONE (DELTASONE) 50 MG tablet  Daily        09/14/20 0039    predniSONE (DELTASONE) 50 MG tablet  Daily,   Status:  Discontinued        09/14/20 0000           Haskel SchroederBadalamente, Brayn Eckstein R, PA-C 09/14/20 0107    Horton, Clabe SealKristie M, DO 09/16/20 1930

## 2020-09-24 DIAGNOSIS — G4700 Insomnia, unspecified: Secondary | ICD-10-CM

## 2020-09-24 DIAGNOSIS — U099 Post covid-19 condition, unspecified: Secondary | ICD-10-CM | POA: Insufficient documentation

## 2020-09-24 HISTORY — DX: Post covid-19 condition, unspecified: U09.9

## 2020-09-24 HISTORY — DX: Insomnia, unspecified: G47.00

## 2020-10-06 ENCOUNTER — Other Ambulatory Visit: Payer: Self-pay

## 2020-10-06 ENCOUNTER — Emergency Department (HOSPITAL_COMMUNITY): Payer: Self-pay

## 2020-10-06 ENCOUNTER — Inpatient Hospital Stay (HOSPITAL_COMMUNITY)
Admission: EM | Admit: 2020-10-06 | Discharge: 2020-10-11 | DRG: 190 | Disposition: A | Discharge status: Home / Self Care | Payer: Self-pay | Attending: Internal Medicine | Admitting: Internal Medicine

## 2020-10-06 ENCOUNTER — Encounter (HOSPITAL_COMMUNITY): Payer: Self-pay | Admitting: Emergency Medicine

## 2020-10-06 DIAGNOSIS — N390 Urinary tract infection, site not specified: Secondary | ICD-10-CM | POA: Diagnosis present

## 2020-10-06 DIAGNOSIS — Z79899 Other long term (current) drug therapy: Secondary | ICD-10-CM

## 2020-10-06 DIAGNOSIS — E1169 Type 2 diabetes mellitus with other specified complication: Secondary | ICD-10-CM

## 2020-10-06 DIAGNOSIS — E785 Hyperlipidemia, unspecified: Secondary | ICD-10-CM | POA: Diagnosis present

## 2020-10-06 DIAGNOSIS — E875 Hyperkalemia: Secondary | ICD-10-CM | POA: Diagnosis not present

## 2020-10-06 DIAGNOSIS — Z6841 Body Mass Index (BMI) 40.0 and over, adult: Secondary | ICD-10-CM

## 2020-10-06 DIAGNOSIS — J9601 Acute respiratory failure with hypoxia: Secondary | ICD-10-CM | POA: Diagnosis present

## 2020-10-06 DIAGNOSIS — J441 Chronic obstructive pulmonary disease with (acute) exacerbation: Principal | ICD-10-CM | POA: Diagnosis present

## 2020-10-06 DIAGNOSIS — Z8616 Personal history of COVID-19: Secondary | ICD-10-CM

## 2020-10-06 DIAGNOSIS — R739 Hyperglycemia, unspecified: Secondary | ICD-10-CM

## 2020-10-06 DIAGNOSIS — Z7982 Long term (current) use of aspirin: Secondary | ICD-10-CM

## 2020-10-06 DIAGNOSIS — Z9981 Dependence on supplemental oxygen: Secondary | ICD-10-CM

## 2020-10-06 DIAGNOSIS — I10 Essential (primary) hypertension: Secondary | ICD-10-CM | POA: Diagnosis present

## 2020-10-06 DIAGNOSIS — E119 Type 2 diabetes mellitus without complications: Secondary | ICD-10-CM

## 2020-10-06 DIAGNOSIS — R0602 Shortness of breath: Secondary | ICD-10-CM

## 2020-10-06 DIAGNOSIS — E1165 Type 2 diabetes mellitus with hyperglycemia: Secondary | ICD-10-CM | POA: Diagnosis present

## 2020-10-06 LAB — BASIC METABOLIC PANEL
Anion gap: 7 (ref 5–15)
BUN: 13 mg/dL (ref 8–23)
CO2: 32 mmol/L (ref 22–32)
Calcium: 9.4 mg/dL (ref 8.9–10.3)
Chloride: 98 mmol/L (ref 98–111)
Creatinine, Ser: 0.82 mg/dL (ref 0.44–1.00)
GFR, Estimated: >60 mL/min (ref >60)
Glucose, Bld: 420 mg/dL — ABNORMAL HIGH (ref 70–99)
Potassium: 4.5 mmol/L (ref 3.5–5.1)
Sodium: 137 mmol/L (ref 135–145)

## 2020-10-06 LAB — TROPONIN I (HIGH SENSITIVITY): Troponin I (High Sensitivity): 17 ng/L (ref <18)

## 2020-10-06 LAB — CBC
HCT: 42.6 % (ref 36.0–46.0)
Hemoglobin: 12.7 g/dL (ref 12.0–15.0)
MCH: 28.8 pg (ref 26.0–34.0)
MCHC: 29.8 g/dL — ABNORMAL LOW (ref 30.0–36.0)
MCV: 96.6 fL (ref 80.0–100.0)
Platelets: 223 10*3/uL (ref 150–400)
RBC: 4.41 MIL/uL (ref 3.87–5.11)
RDW: 17.9 % — ABNORMAL HIGH (ref 11.5–15.5)
WBC: 7.4 10*3/uL (ref 4.0–10.5)
nRBC: 0.3 % — ABNORMAL HIGH (ref 0.0–0.2)

## 2020-10-06 MED ORDER — SODIUM CHLORIDE 0.9 % IV BOLUS
1000.0000 mL | Freq: Once | INTRAVENOUS | Status: AC
  Administered 2020-10-07: 1000 mL via INTRAVENOUS

## 2020-10-06 MED ORDER — METOPROLOL TARTRATE 25 MG PO TABS
100.0000 mg | ORAL_TABLET | Freq: Once | ORAL | Status: AC
  Administered 2020-10-07: 100 mg via ORAL
  Filled 2020-10-06: qty 4

## 2020-10-06 MED ORDER — ALBUTEROL SULFATE (2.5 MG/3ML) 0.083% IN NEBU
INHALATION_SOLUTION | RESPIRATORY_TRACT | Status: AC
  Administered 2020-10-06: 2.5 mg via RESPIRATORY_TRACT
  Filled 2020-10-06: qty 3

## 2020-10-06 MED ORDER — ALBUTEROL SULFATE (2.5 MG/3ML) 0.083% IN NEBU
2.5000 mg | INHALATION_SOLUTION | RESPIRATORY_TRACT | Status: DC
  Filled 2020-10-06: qty 3

## 2020-10-06 NOTE — ED Notes (Signed)
Dr. Bebe Shaggy at bedside. Patient states she does not wish to do the CT scan unless her daughter can be here, her daughter can not come until the morning. Dr. Bebe Shaggy gave VO for repeat D-dimer prior to CTA

## 2020-10-06 NOTE — ED Notes (Signed)
Unsuccessful IV attempt by this RN x1

## 2020-10-06 NOTE — ED Notes (Signed)
ED Provider at bedside. 

## 2020-10-06 NOTE — ED Notes (Signed)
RT at bedside.

## 2020-10-06 NOTE — ED Notes (Signed)
Josh, RT made aware of RT orders

## 2020-10-06 NOTE — ED Provider Notes (Incomplete)
[facility] EMERGENCY DEPARTMENT Provider Note   CSN: [id number] Arrival date & time: 10/06/20  1802     History Chief Complaint  Patient presents with  . Shortness of Breath    Julia Henderson is a 60 y.o. female with a history of hypertension, hypercholesteremia, chronic bronchitis who presents to the emergency department from home with a chief complaint of shortness of breath.  The patient endorses a 3 to 4-day history of  The history is provided by the patient and medical records. No language interpreter was used.       Past Medical History:  Diagnosis Date  . Bronchitis   . High cholesterol   . Hypertension     There are no problems to display for this patient.   Past Surgical History:  Procedure Laterality Date  . CESAREAN SECTION       OB History   No obstetric history on file.     No family history on file.  Social History   Tobacco Use  . Smoking status: Never Smoker  . Smokeless tobacco: Never Used  Substance Use Topics  . Alcohol use: Yes  . Drug use: Not Currently    Home Medications Prior to Admission medications   Medication Sig Start Date End Date Taking? Authorizing Provider  albuterol (VENTOLIN HFA) 108 (90 Base) MCG/ACT inhaler Inhale 2 puffs into the lungs every 4 (four) hours as needed for wheezing or shortness of breath. 09/14/20  Yes Badalamente, Peter R, PA-C  amLODipine (NORVASC) 5 MG tablet Take 5 mg by mouth in the morning and at bedtime.   Yes [provider]  aspirin EC 81 MG tablet Take 81 mg by mouth daily. Swallow whole.   Yes [provider]  benzonatate (TESSALON) 100 MG capsule Take 1 capsule (100 mg total) by mouth every 8 (eight) hours as needed for cough. 09/14/20  Yes Haskel Schroeder, PA-C  cetirizine (ZYRTEC ALLERGY) 10 MG tablet Take 10 mg by mouth daily.   Yes [provider]  Cholecalciferol (VITAMIN D3) 125 MCG (5000 UT) TABS Take 5,000 Units by mouth daily.   Yes  [provider]  gabapentin (NEURONTIN) 300 MG capsule Take 900 mg by mouth 3 (three) times daily.   Yes [provider]  lisinopril (ZESTRIL) 20 MG tablet Take 1 tablet (20 mg total) by mouth daily for 14 days. 09/14/20 09/28/20 Yes Badalamente, Rose Phi, PA-C  metoprolol tartrate (LOPRESSOR) 100 MG tablet Take 100 mg by mouth 2 (two) times daily.   Yes [provider]  montelukast (SINGULAIR) 10 MG tablet Take 10 mg by mouth at bedtime.   Yes [provider]  Multiple Vitamin (MULTIVITAMIN) tablet Take 1 tablet by mouth daily.   Yes [provider]  Omega-3 Fatty Acids (FISH OIL) 1000 MG CAPS Take 1,000 mg by mouth daily.   Yes [provider]  pravastatin (PRAVACHOL) 40 MG tablet Take 40 mg by mouth at bedtime.   Yes [provider]  spironolactone (ALDACTONE) 25 MG tablet Take 25 mg by mouth daily.   Yes [provider]  traZODone (DESYREL) 100 MG tablet Take 100 mg by mouth at bedtime.   Yes [provider]  vitamin C (ASCORBIC ACID) 500 MG tablet Take 500 mg by mouth daily.   Yes [provider]  azithromycin (ZITHROMAX) 250 MG tablet Take 1 tablet (250 mg total) by mouth daily. Take first 2 tablets together, then 1 every day until finished. Patient not taking:  No sig reported 06/02/20   Terrilee Files, MD    Allergies    Celexa [citalopram]  Review of Systems   Review of Systems    Physical Exam Updated Vital Signs BP (!) 155/138   Pulse (!) 139   Temp 98.4 F (36.9 C)   Resp (!) 23   SpO2 99%   Physical Exam  ED Results / Procedures / Treatments   Labs (all labs ordered are listed, but only abnormal results are displayed) Labs Reviewed  BASIC METABOLIC PANEL - Abnormal; Notable for the following components:      Result Value   Glucose, Bld 420 (*)    All other components within normal limits  CBC - Abnormal; Notable for the following components:   MCHC 29.8 (*)    RDW 17.9 (*)     nRBC 0.3 (*)    All other components within normal limits  HEPATIC FUNCTION PANEL  URINALYSIS, ROUTINE W REFLEX MICROSCOPIC  I-STAT ARTERIAL BLOOD GAS, ED  TROPONIN I (HIGH SENSITIVITY)  TROPONIN I (HIGH SENSITIVITY)    EKG EKG Interpretation  Date/Time:  Sunday October 06 2020 18:52:22 EDT Ventricular Rate:  117 PR Interval:  152 QRS Duration: 76 QT Interval:  322 QTC Calculation: 449 R Axis:   103 Text Interpretation: Sinus tachycardia Rightward axis Borderline ECG Confirmed by Lorre Nick ([id number]) on 10/06/2020 9:46:54 PM   Radiology DG Chest 2 View  Result Date: 10/06/2020 CLINICAL DATA:  60 year old female with shortness of breath. EXAM: CHEST - 2 VIEW COMPARISON:  Chest radiograph dated 09/13/2020. FINDINGS: No focal consolidation, pleural effusion or pneumothorax. Top-normal cardiac size. No acute osseous pathology. IMPRESSION: No active cardiopulmonary disease. Electronically Signed   By: Elgie Collard M.D.   On: 10/06/2020 19:35    Procedures Procedures {Remember to document critical care time when appropriate:1}  Medications Ordered in ED Medications  sodium chloride 0.9 % bolus 1,000 mL (has no administration in time range)    ED Course  I have reviewed the triage vital signs and the nursing notes.  Pertinent labs & imaging results that were available during my care of the patient were reviewed by me and considered in my medical decision making (see chart for details).    MDM Rules/Calculators/A&P                          *** Final Clinical Impression(s) / ED Diagnoses Final diagnoses:  None    Rx / DC Orders ED Discharge Orders    None

## 2020-10-06 NOTE — ED Triage Notes (Signed)
Pt reports COVID + on 2/5.  Wears 4 liters O2 at home.  C/o cough with yellow and green phlegm, SOB, body aches, chills, and headache x 4 days.

## 2020-10-06 NOTE — ED Provider Notes (Addendum)
[facility] EMERGENCY DEPARTMENT Provider Note   CSN: [id number] Arrival date & time: 10/06/20  1802     History Chief Complaint  Patient presents with   Shortness of Breath    Julia Henderson is a 60 y.o. female with a history of hypertension, hypercholesteremia, chronic bronchitis who presents to the emergency department from home with a chief complaint of shortness of breath.  The patient endorses a 3 to 4-day history of worsening shortness of breath, productive cough with green and yellow sputum, nausea, body aches, chills, wheezing, and generalized headache.  The patient reports that she wears 4 L of oxygen at home as needed, which she typically only wears with exertion.  However, over the last 4 days since her symptoms have been worsening that she has been wearing oxygen almost 24 hours a day.  She is feeling short of breath both with exertion and at rest.  No chest pain, vomiting, diarrhea, abdominal pain, fever, rash, dizziness, lightheadedness, syncope, neck pain or stiffness, palpitations, leg swelling, orthopnea.  She has an albuterol inhaler with a spacer that she has been using at home.  She reports more frequent usage over the last few days.  She has used her inhaler once today, early this afternoon, with minimal improvement in her symptoms.  She reports that she has been struggling with her breathing more frequently since she was diagnosed with Covid in February.  She had a 4 to 5-day admission at [facility] in February related to COVID-19.  She was seen in the ER on February 25 for shortness of breath and was discharged home with a course of prednisone.  She reports that her symptoms improved after completing the course.  She has had no other recent corticosteroid administration.  She has no history of diabetes.  She does feel that she has been more thirsty and urinating more frequently over the last couple of months.  She is a never smoker.  However,  she does have a history of chronic bronchitis.  States the symptoms feel similar to previous flares of bronchitis.  No recent long travel.  She has been more immobilized recently secondary to her symptoms.  No recent long travel or surgery.  No personal or familial history of VTE.   The history is provided by the patient and medical records. No language interpreter was used.       Past Medical History:  Diagnosis Date   Bronchitis    High cholesterol    Hypertension     Patient Active Problem List   Diagnosis Date Noted   Acute respiratory failure with hypoxia (HCC) 10/07/2020   HTN (hypertension) 10/07/2020   Morbid obesity (HCC) 10/07/2020   DM2 (diabetes mellitus, type 2) (HCC) 10/07/2020    Past Surgical History:  Procedure Laterality Date   CESAREAN SECTION       OB History   No obstetric history on file.     Family History  Problem Relation Age of Onset   Lung disease Neg Hx     Social History   Tobacco Use   Smoking status: Never Smoker   Smokeless tobacco: Never Used  Substance Use Topics   Alcohol use: Yes   Drug use: Not Currently    Home Medications Prior to Admission medications   Medication Sig Start Date End Date Taking? Authorizing Provider  albuterol (VENTOLIN HFA) 108 (90 Base) MCG/ACT inhaler Inhale 2 puffs into the lungs every 4 (four) hours as needed for wheezing or shortness  of breath. 09/14/20  Yes Badalamente, Rose Phi, PA-C  amLODipine (NORVASC) 5 MG tablet Take 5 mg by mouth in the morning and at bedtime.   Yes [provider]  aspirin EC 81 MG tablet Take 81 mg by mouth daily. Swallow whole.   Yes [provider]  benzonatate (TESSALON) 100 MG capsule Take 1 capsule (100 mg total) by mouth every 8 (eight) hours as needed for cough. 09/14/20  Yes Haskel Schroeder, PA-C  cetirizine (ZYRTEC ALLERGY) 10 MG tablet Take 10 mg by mouth daily.   Yes [provider]  Cholecalciferol (VITAMIN D3) 125 MCG  (5000 UT) TABS Take 5,000 Units by mouth daily.   Yes [provider]  gabapentin (NEURONTIN) 300 MG capsule Take 900 mg by mouth 3 (three) times daily.   Yes [provider]  lisinopril (ZESTRIL) 20 MG tablet Take 1 tablet (20 mg total) by mouth daily for 14 days. 09/14/20 09/28/20 Yes Badalamente, Rose Phi, PA-C  metoprolol tartrate (LOPRESSOR) 100 MG tablet Take 100 mg by mouth 2 (two) times daily.   Yes [provider]  montelukast (SINGULAIR) 10 MG tablet Take 10 mg by mouth at bedtime.   Yes [provider]  Multiple Vitamin (MULTIVITAMIN) tablet Take 1 tablet by mouth daily.   Yes [provider]  Omega-3 Fatty Acids (FISH OIL) 1000 MG CAPS Take 1,000 mg by mouth daily.   Yes [provider]  pravastatin (PRAVACHOL) 40 MG tablet Take 40 mg by mouth at bedtime.   Yes [provider]  spironolactone (ALDACTONE) 25 MG tablet Take 25 mg by mouth daily.   Yes [provider]  traZODone (DESYREL) 100 MG tablet Take 100 mg by mouth at bedtime.   Yes [provider]  vitamin C (ASCORBIC ACID) 500 MG tablet Take 500 mg by mouth daily.   Yes [provider]    Allergies    Celexa [citalopram]  Review of Systems   Review of Systems  Constitutional: Positive for fatigue. Negative for activity change, chills, diaphoresis and fever.  HENT: Negative for congestion, ear discharge, ear pain, hearing loss, sinus pressure, sinus pain, sore throat and voice change.   Respiratory: Positive for cough, chest tightness, shortness of breath and wheezing. Negative for apnea and choking.   Cardiovascular: Negative for chest pain, palpitations and leg swelling.  Gastrointestinal: Negative for abdominal pain, blood in stool, constipation, diarrhea, nausea and vomiting.  Genitourinary: Negative for dysuria.  Musculoskeletal: Negative for back pain, myalgias, neck pain and neck stiffness.  Skin: Negative for rash.   Allergic/Immunologic: Negative for immunocompromised state.  Neurological: Positive for weakness and headaches. Negative for seizures, syncope, speech difficulty and numbness.  Psychiatric/Behavioral: Negative for confusion.    Physical Exam Updated Vital Signs BP 135/78 (BP Location: Left Arm)    Pulse 96    Temp 98.6 F (37 C) (Oral)    Resp 17    Wt 121.8 kg    SpO2 95%    BMI 50.73 kg/m   Physical Exam Vitals and nursing note reviewed.  Constitutional:      General: She is not in acute distress.    Appearance: She is obese. She is ill-appearing. She is not toxic-appearing or diaphoretic.  HENT:     Head: Normocephalic.     Nose: Nose normal. No congestion or rhinorrhea.     Mouth/Throat:     Mouth: Mucous membranes are moist.     Pharynx: No oropharyngeal exudate or posterior oropharyngeal erythema.  Eyes:     General: No scleral icterus.    Conjunctiva/sclera: Conjunctivae normal.  Neck:     Vascular: No carotid bruit.  Cardiovascular:     Rate and Rhythm: Normal rate and regular rhythm.     Pulses: Normal pulses.     Heart sounds: Normal heart sounds. No murmur heard. No friction rub. No gallop.   Pulmonary:     Effort: No respiratory distress.     Breath sounds: No stridor. Wheezing present. No rhonchi or rales.     Comments: Lung sounds are diminished throughout.  Faint, scattered wheezes noted in all fields.  She is tachypneic.  No retractions or accessory muscle use. Chest:     Chest wall: No tenderness.  Abdominal:     General: There is no distension.     Palpations: Abdomen is soft. There is no mass.     Tenderness: There is no abdominal tenderness. There is no right CVA tenderness, left CVA tenderness, guarding or rebound.     Hernia: No hernia is present.  Musculoskeletal:        General: No tenderness.     Cervical back: Normal range of motion and neck supple. No rigidity or tenderness.     Right lower leg: No edema.     Left lower leg: No edema.   Lymphadenopathy:     Cervical: No cervical adenopathy.  Skin:    General: Skin is warm.     Findings: No rash.  Neurological:     General: No focal deficit present.     Mental Status: She is alert.  Psychiatric:        Behavior: Behavior normal.     ED Results / Procedures / Treatments   Labs (all labs ordered are listed, but only abnormal results are displayed) Labs Reviewed  RESPIRATORY PANEL BY PCR - Abnormal; Notable for the following components:      Result Value   Rhinovirus / Enterovirus DETECTED (*)    All other components within normal limits  BASIC METABOLIC PANEL - Abnormal; Notable for the following components:   Glucose, Bld 420 (*)    All other components within normal limits  CBC - Abnormal; Notable for the following components:   MCHC 29.8 (*)    RDW 17.9 (*)    nRBC 0.3 (*)    All other components within normal limits  HEPATIC FUNCTION PANEL - Abnormal; Notable for the following components:   AST 62 (*)    All other components within normal limits  URINALYSIS, ROUTINE W REFLEX MICROSCOPIC - Abnormal; Notable for the following components:   Color, Urine AMBER (*)    APPearance CLOUDY (*)    Glucose, UA 150 (*)    Hgb urine dipstick SMALL (*)    Bilirubin Urine SMALL (*)    Ketones, ur 5 (*)    Protein, ur 100 (*)    Leukocytes,Ua MODERATE (*)    WBC, UA >50 (*)    Bacteria, UA RARE (*)    Squamous Epithelial / LPF >50 (*)    All other components within normal limits  D-DIMER, QUANTITATIVE - Abnormal; Notable for the following components:   D-Dimer, Quant 0.57 (*)    All other components within normal limits  CBC - Abnormal; Notable for the following components:   WBC 11.8 (*)    RDW 18.3 (*)    nRBC 0.3 (*)    All other components within normal limits  BASIC METABOLIC PANEL - Abnormal; Notable for  the following components:   Chloride 97 (*)    Glucose, Bld 398 (*)    Creatinine, Ser 1.22 (*)    GFR, Estimated 50 (*)    All other components  within normal limits  HEMOGLOBIN A1C - Abnormal; Notable for the following components:   Hgb A1c MFr Bld 10.3 (*)    All other components within normal limits  GLUCOSE, CAPILLARY - Abnormal; Notable for the following components:   Glucose-Capillary 439 (*)    All other components within normal limits  GLUCOSE, RANDOM - Abnormal; Notable for the following components:   Glucose, Bld 433 (*)    All other components within normal limits  I-STAT ARTERIAL BLOOD GAS, ED - Abnormal; Notable for the following components:   pCO2 arterial 50.2 (*)    Bicarbonate 29.1 (*)    Acid-Base Excess 3.0 (*)    All other components within normal limits  CBG MONITORING, ED - Abnormal; Notable for the following components:   Glucose-Capillary 203 (*)    All other components within normal limits  CBG MONITORING, ED - Abnormal; Notable for the following components:   Glucose-Capillary 494 (*)    All other components within normal limits  CBG MONITORING, ED - Abnormal; Notable for the following components:   Glucose-Capillary 464 (*)    All other components within normal limits  CBG MONITORING, ED - Abnormal; Notable for the following components:   Glucose-Capillary 485 (*)    All other components within normal limits  TROPONIN I (HIGH SENSITIVITY) - Abnormal; Notable for the following components:   Troponin I (High Sensitivity) 18 (*)    All other components within normal limits  URINE CULTURE  HIV ANTIBODY (ROUTINE TESTING W REFLEX)  CBC WITH DIFFERENTIAL/PLATELET  BASIC METABOLIC PANEL  MAGNESIUM  I-STAT ARTERIAL BLOOD GAS, ED  TROPONIN I (HIGH SENSITIVITY)    EKG EKG Interpretation  Date/Time:  Sunday October 06 2020 18:52:22 EDT Ventricular Rate:  117 PR Interval:  152 QRS Duration: 76 QT Interval:  322 QTC Calculation: 449 R Axis:   103 Text Interpretation: Sinus tachycardia Rightward axis Borderline ECG Confirmed by Lorre Nick ([id number]) on 10/06/2020 9:46:54 PM   Radiology DG Chest 2  View  Result Date: 10/06/2020 CLINICAL DATA:  60 year old female with shortness of breath. EXAM: CHEST - 2 VIEW COMPARISON:  Chest radiograph dated 09/13/2020. FINDINGS: No focal consolidation, pleural effusion or pneumothorax. Top-normal cardiac size. No acute osseous pathology. IMPRESSION: No active cardiopulmonary disease. Electronically Signed   By: Elgie Collard M.D.   On: 10/06/2020 19:35   CT ANGIO CHEST PE W OR WO CONTRAST  Result Date: 10/07/2020 CLINICAL DATA:  Shortness of breath EXAM: CT ANGIOGRAPHY CHEST WITH CONTRAST TECHNIQUE: Multidetector CT imaging of the chest was performed using the standard protocol during bolus administration of intravenous contrast. Multiplanar CT image reconstructions and MIPs were obtained to evaluate the vascular anatomy. CONTRAST:  51mL OMNIPAQUE IOHEXOL 350 MG/ML SOLN COMPARISON:  Chest radiograph October 06, 2020 FINDINGS: Cardiovascular: There is no demonstrable pulmonary embolus. There is no thoracic aortic aneurysm or dissection. There are occasional foci of calcification in visualized great vessels. There are occasional foci of coronary artery calcification. There is no pericardial effusion or pericardial thickening. Mediastinum/Nodes: Visualized thyroid appears unremarkable. No evident thoracic adenopathy. No esophageal lesions are appreciable. Lungs/Pleura: There are scattered foci of atelectatic change. No edema or airspace opacity present. No pleural effusions. No pneumothorax. Trachea and major bronchial structures appear unremarkable. Upper Abdomen: Visualized upper abdominal structures appear unremarkable.  Musculoskeletal: No blastic or lytic bone lesions. No evident chest wall lesion. Review of the MIP images confirms the above findings. IMPRESSION: 1. No demonstrable pulmonary embolus. No thoracic aortic aneurysm or dissection. There are scattered foci of coronary artery calcification. 2. Scattered areas of atelectasis. No edema or airspace opacity.  No evident pleural effusions. 3.  No evident adenopathy. Electronically Signed   By: Bretta Bang III M.D.   On: 10/07/2020 13:33    Procedures .Critical Care Performed by: Barkley Boards, PA-C Authorized by: Barkley Boards, PA-C   Critical care provider statement:    Critical care time (minutes):  40   Critical care time was exclusive of:  Separately billable procedures and treating other patients and teaching time   Critical care was necessary to treat or prevent imminent or life-threatening deterioration of the following conditions:  Respiratory failure   Critical care was time spent personally by me on the following activities:  Ordering and performing treatments and interventions, ordering and review of laboratory studies, ordering and review of radiographic studies, pulse oximetry, re-evaluation of patient's condition, obtaining history from patient or surrogate, examination of patient, evaluation of patient's response to treatment, development of treatment plan with patient or surrogate and review of old charts   I assumed direction of critical care for this patient from another provider in my specialty: no     Care discussed with: admitting provider       Medications Ordered in ED Medications  LORazepam (ATIVAN) injection 0.5-1 mg (1 mg Intravenous Given 10/07/20 1259)  cefTRIAXone (ROCEPHIN) 1 g in sodium chloride 0.9 % 100 mL IVPB (0 g Intravenous Stopped 10/07/20 0353)  mometasone-formoterol (DULERA) 200-5 MCG/ACT inhaler 2 puff (2 puffs Inhalation Not Given 10/07/20 2019)  albuterol (PROVENTIL) (2.5 MG/3ML) 0.083% nebulizer solution 2.5 mg (has no administration in time range)  umeclidinium bromide (INCRUSE ELLIPTA) 62.5 MCG/INH 1 puff (1 puff Inhalation Given 10/07/20 0913)  amLODipine (NORVASC) tablet 5 mg (5 mg Oral Given 10/07/20 2117)  benzonatate (TESSALON) capsule 100 mg (100 mg Oral Given 10/07/20 0933)  loratadine (CLARITIN) tablet 10 mg (10 mg Oral Given 10/07/20  0929)  aspirin EC tablet 81 mg (81 mg Oral Given 10/07/20 0929)  gabapentin (NEURONTIN) capsule 900 mg (900 mg Oral Given 10/07/20 2118)  cholecalciferol (VITAMIN D3) tablet 5,000 Units (5,000 Units Oral Given 10/07/20 0931)  metoprolol tartrate (LOPRESSOR) tablet 100 mg (100 mg Oral Given 10/07/20 2118)  montelukast (SINGULAIR) tablet 10 mg (10 mg Oral Given 10/07/20 2118)  multivitamin with minerals tablet 1 tablet (1 tablet Oral Given 10/07/20 0937)  omega-3 acid ethyl esters (LOVAZA) capsule 1,000 mg (1,000 mg Oral Given 10/07/20 0930)  pravastatin (PRAVACHOL) tablet 40 mg (40 mg Oral Given 10/07/20 2118)  spironolactone (ALDACTONE) tablet 25 mg (25 mg Oral Given 10/07/20 0933)  traZODone (DESYREL) tablet 100 mg (100 mg Oral Given 10/07/20 2118)  ascorbic acid (VITAMIN C) tablet 500 mg (500 mg Oral Given 10/07/20 0929)  acetaminophen (TYLENOL) tablet 650 mg (650 mg Oral Given 10/07/20 0928)  insulin aspart (novoLOG) injection 0-20 Units (20 Units Subcutaneous Given 10/07/20 1730)  insulin aspart (novoLOG) injection 0-5 Units (5 Units Subcutaneous Given 10/07/20 2119)  budesonide (PULMICORT) nebulizer solution 0.5 mg (0.5 mg Nebulization Given 10/07/20 2017)  ipratropium-albuterol (DUONEB) 0.5-2.5 (3) MG/3ML nebulizer solution 3 mL (3 mLs Nebulization Given 10/08/20 0026)  pantoprazole (PROTONIX) EC tablet 40 mg (40 mg Oral Given 10/07/20 0929)  fluticasone (FLONASE) 50 MCG/ACT nasal spray 2 spray (2 sprays Each  Nare Given 10/07/20 0912)  guaiFENesin (MUCINEX) 12 hr tablet 1,200 mg (1,200 mg Oral Given 10/07/20 2118)  insulin glargine (LANTUS) injection 15 Units (15 Units Subcutaneous Given 10/07/20 0953)  methylPREDNISolone sodium succinate (SOLU-MEDROL) 125 mg/2 mL injection 60 mg (60 mg Intravenous Given 10/07/20 2117)  insulin aspart (novoLOG) injection 4 Units (4 Units Subcutaneous Given 10/07/20 1731)  enoxaparin (LOVENOX) injection 60 mg (60 mg Subcutaneous Given 10/07/20 1726)  sodium chloride 0.9 %  bolus 1,000 mL (0 mLs Intravenous Stopped 10/07/20 0200)  metoprolol tartrate (LOPRESSOR) tablet 100 mg (100 mg Oral Given 10/07/20 0040)  methylPREDNISolone sodium succinate (SOLU-MEDROL) 125 mg/2 mL injection 125 mg (125 mg Intravenous Given 10/07/20 0107)  insulin aspart (novoLOG) injection 5 Units (5 Units Subcutaneous Given 10/07/20 0915)  iohexol (OMNIPAQUE) 350 MG/ML injection 60 mL (60 mLs Intravenous Contrast Given 10/07/20 1306)  insulin aspart (novoLOG) injection 14 Units (14 Units Subcutaneous Given 10/07/20 2119)    ED Course  I have reviewed the triage vital signs and the nursing notes.  Pertinent labs & imaging results that were available during my care of the patient were reviewed by me and considered in my medical decision making (see chart for details).    MDM Rules/Calculators/A&P                          60 year old female with a history of hypertension, hypercholesteremia, chronic bronchitis who presents the emergency department with 3 to 4-day history of worsening shortness of breath, nonproductive cough, body aches, myalgias.  Tachycardic in the 120-140s.  She is normotensive and tachypneic.  The patient was seen and independently evaluated by Dr. Bebe ShaggyWickline, attending physician.  On exam, patient's lung sounds are very diminished.  She has wheezes noted throughout.   Patient is on metoprolol and has not taken her nighttime dose.  Will administer her nighttime dose of metoprolol.  Labs and imaging have been reviewed and independently interpreted by me.  No leukocytosis.  Chest x-ray is unremarkable, despite patient's pulmonary exam.  She is noted to be hyperglycemic at 420 with a normal anion gap and bicarb.  Patient has no known history of diabetes.  She was noted to be hyperglycemic at her last ER visits, but this could be related to recent courses of corticosteroids.  We will add on urinalysis.   Patient was given multiple duonebs and given IV fluids to treat  hyperglycemia.  She has also been given Solu-Medrol.  On reevaluation, she had no improvement in her respiratory status.  She continued to have increased work of breathing.  Given her tachycardia, there was concern she may have a PE.  D-dimer was obtained and was 0.57, which is negative when age-adjusted for the patient.  I do feel that the patient would benefit from CT imaging of her chest, but patient is adamant that she does not want a CT exam until her daughter can be present, which would not be until in the morning.  Since her D-dimer is negative when age-adjusted, I am less suspicious for PE.  However, would consider CT imaging when patient is agreeable after admission.  COVID-19 test has not been ordered as patient had COVID-19 in February where she had a 4 to 5-day admission at [facility].  Given her continued work of breathing, consult to the hospitalist team and Dr. Julian ReilGardner will accept the patient for admission. The patient appears reasonably stabilized for admission considering the current resources, flow, and capabilities available in the  ED at this time, and I doubt any other [facility] requiring further screening and/or treatment in the ED prior to admission.   Final Clinical Impression(s) / ED Diagnoses Final diagnoses:  Hyperglycemia  Shortness of breath    Rx / DC Orders ED Discharge Orders    None       Barkley Boards, PA-C 10/07/20 0714    Barkley Boards, PA-C 10/08/20 0032    Zadie Rhine, MD 10/08/20 [phone]

## 2020-10-07 ENCOUNTER — Observation Stay (HOSPITAL_COMMUNITY): Payer: Self-pay

## 2020-10-07 ENCOUNTER — Encounter (HOSPITAL_COMMUNITY): Payer: Self-pay | Admitting: Internal Medicine

## 2020-10-07 DIAGNOSIS — J9601 Acute respiratory failure with hypoxia: Secondary | ICD-10-CM

## 2020-10-07 DIAGNOSIS — R739 Hyperglycemia, unspecified: Secondary | ICD-10-CM

## 2020-10-07 DIAGNOSIS — I10 Essential (primary) hypertension: Secondary | ICD-10-CM

## 2020-10-07 DIAGNOSIS — E119 Type 2 diabetes mellitus without complications: Secondary | ICD-10-CM

## 2020-10-07 DIAGNOSIS — E1169 Type 2 diabetes mellitus with other specified complication: Secondary | ICD-10-CM

## 2020-10-07 DIAGNOSIS — R0602 Shortness of breath: Secondary | ICD-10-CM

## 2020-10-07 HISTORY — DX: Type 2 diabetes mellitus without complications: E11.9

## 2020-10-07 HISTORY — DX: Essential (primary) hypertension: I10

## 2020-10-07 HISTORY — DX: Acute respiratory failure with hypoxia: J96.01

## 2020-10-07 HISTORY — DX: Morbid (severe) obesity due to excess calories: E66.01

## 2020-10-07 HISTORY — DX: Type 2 diabetes mellitus with other specified complication: E11.69

## 2020-10-07 LAB — RESPIRATORY PANEL BY PCR

## 2020-10-07 LAB — URINALYSIS, ROUTINE W REFLEX MICROSCOPIC
Glucose, UA: 150 mg/dL — AB
Ketones, ur: 5 mg/dL — AB
Nitrite: NEGATIVE
Protein, ur: 100 mg/dL — AB
Specific Gravity, Urine: 1.028 (ref 1.005–1.030)
Squamous Epithelial / HPF: >50 — ABNORMAL HIGH (ref 0–5)
WBC, UA: >50 WBC/hpf — ABNORMAL HIGH (ref 0–5)
pH: 5 (ref 5.0–8.0)

## 2020-10-07 LAB — BASIC METABOLIC PANEL
Anion gap: 12 (ref 5–15)
BUN: 16 mg/dL (ref 8–23)
CO2: 26 mmol/L (ref 22–32)
Calcium: 9.1 mg/dL (ref 8.9–10.3)
Chloride: 97 mmol/L — ABNORMAL LOW (ref 98–111)
Creatinine, Ser: 1.22 mg/dL — ABNORMAL HIGH (ref 0.44–1.00)
GFR, Estimated: 50 mL/min — ABNORMAL LOW (ref >60)
Glucose, Bld: 398 mg/dL — ABNORMAL HIGH (ref 70–99)
Potassium: 4.5 mmol/L (ref 3.5–5.1)
Sodium: 135 mmol/L (ref 135–145)

## 2020-10-07 LAB — I-STAT ARTERIAL BLOOD GAS, ED
Acid-Base Excess: 3 mmol/L — ABNORMAL HIGH (ref 0.0–2.0)
Bicarbonate: 29.1 mmol/L — ABNORMAL HIGH (ref 20.0–28.0)
Calcium, Ion: 1.2 mmol/L (ref 1.15–1.40)
HCT: 42 % (ref 36.0–46.0)
Hemoglobin: 14.3 g/dL (ref 12.0–15.0)
O2 Saturation: 96 %
Patient temperature: 98.4
Potassium: 3.8 mmol/L (ref 3.5–5.1)
Sodium: 138 mmol/L (ref 135–145)
TCO2: 31 mmol/L (ref 22–32)
pCO2 arterial: 50.2 mmHg — ABNORMAL HIGH (ref 32.0–48.0)
pH, Arterial: 7.37 (ref 7.350–7.450)
pO2, Arterial: 85 mmHg (ref 83.0–108.0)

## 2020-10-07 LAB — CBC
HCT: 44.3 % (ref 36.0–46.0)
Hemoglobin: 13.4 g/dL (ref 12.0–15.0)
MCH: 29.3 pg (ref 26.0–34.0)
MCHC: 30.2 g/dL (ref 30.0–36.0)
MCV: 96.7 fL (ref 80.0–100.0)
Platelets: 247 10*3/uL (ref 150–400)
RBC: 4.58 MIL/uL (ref 3.87–5.11)
RDW: 18.3 % — ABNORMAL HIGH (ref 11.5–15.5)
WBC: 11.8 10*3/uL — ABNORMAL HIGH (ref 4.0–10.5)
nRBC: 0.3 % — ABNORMAL HIGH (ref 0.0–0.2)

## 2020-10-07 LAB — HEPATIC FUNCTION PANEL
ALT: 43 U/L (ref 0–44)
AST: 62 U/L — ABNORMAL HIGH (ref 15–41)
Albumin: 3.7 g/dL (ref 3.5–5.0)
Alkaline Phosphatase: 58 U/L (ref 38–126)
Bilirubin, Direct: 0.2 mg/dL (ref 0.0–0.2)
Indirect Bilirubin: 0.4 mg/dL (ref 0.3–0.9)
Total Bilirubin: 0.6 mg/dL (ref 0.3–1.2)
Total Protein: 7.7 g/dL (ref 6.5–8.1)

## 2020-10-07 LAB — TROPONIN I (HIGH SENSITIVITY): Troponin I (High Sensitivity): 18 ng/L — ABNORMAL HIGH (ref <18)

## 2020-10-07 LAB — D-DIMER, QUANTITATIVE: D-Dimer, Quant: 0.57 ug/mL-FEU — ABNORMAL HIGH (ref 0.00–0.50)

## 2020-10-07 LAB — GLUCOSE, RANDOM: Glucose, Bld: 433 mg/dL — ABNORMAL HIGH (ref 70–99)

## 2020-10-07 LAB — HEMOGLOBIN A1C
Hgb A1c MFr Bld: 10.3 % — ABNORMAL HIGH (ref 4.8–5.6)
Mean Plasma Glucose: 248.91 mg/dL

## 2020-10-07 LAB — CBG MONITORING, ED
Glucose-Capillary: 203 mg/dL — ABNORMAL HIGH (ref 70–99)
Glucose-Capillary: 494 mg/dL — ABNORMAL HIGH (ref 70–99)
Glucose-Capillary: 464 mg/dL — ABNORMAL HIGH (ref 70–99)
Glucose-Capillary: 485 mg/dL — ABNORMAL HIGH (ref 70–99)

## 2020-10-07 LAB — GLUCOSE, CAPILLARY: Glucose-Capillary: 439 mg/dL — ABNORMAL HIGH (ref 70–99)

## 2020-10-07 LAB — HIV ANTIBODY (ROUTINE TESTING W REFLEX): HIV Screen 4th Generation wRfx: NONREACTIVE

## 2020-10-07 MED ORDER — INSULIN ASPART 100 UNIT/ML ~~LOC~~ SOLN
4.0000 [IU] | Freq: Three times a day (TID) | SUBCUTANEOUS | Status: DC
  Administered 2020-10-07: 4 [IU] via SUBCUTANEOUS

## 2020-10-07 MED ORDER — METHYLPREDNISOLONE SODIUM SUCC 125 MG IJ SOLR
125.0000 mg | Freq: Once | INTRAMUSCULAR | Status: AC
  Administered 2020-10-07: 125 mg via INTRAVENOUS
  Filled 2020-10-07: qty 2

## 2020-10-07 MED ORDER — METOPROLOL TARTRATE 100 MG PO TABS
100.0000 mg | ORAL_TABLET | Freq: Two times a day (BID) | ORAL | Status: DC
  Administered 2020-10-07 – 2020-10-11 (×9): 100 mg via ORAL
  Filled 2020-10-07: qty 4
  Filled 2020-10-07 (×8): qty 1

## 2020-10-07 MED ORDER — BUDESONIDE 0.5 MG/2ML IN SUSP
0.5000 mg | Freq: Two times a day (BID) | RESPIRATORY_TRACT | Status: DC
  Administered 2020-10-07 – 2020-10-11 (×8): 0.5 mg via RESPIRATORY_TRACT
  Filled 2020-10-07 (×11): qty 2

## 2020-10-07 MED ORDER — ALBUTEROL SULFATE HFA 108 (90 BASE) MCG/ACT IN AERS: 2.0000 | INHALATION_SPRAY | RESPIRATORY_TRACT | Status: DC | PRN

## 2020-10-07 MED ORDER — OMEGA-3-ACID ETHYL ESTERS 1 G PO CAPS
1000.0000 mg | ORAL_CAPSULE | Freq: Every day | ORAL | Status: DC
  Administered 2020-10-07 – 2020-10-11 (×5): 1000 mg via ORAL
  Filled 2020-10-07 (×6): qty 1

## 2020-10-07 MED ORDER — ENOXAPARIN SODIUM 40 MG/0.4ML ~~LOC~~ SOLN: 40.0000 mg | SUBCUTANEOUS | Status: DC

## 2020-10-07 MED ORDER — ENOXAPARIN SODIUM 60 MG/0.6ML ~~LOC~~ SOLN
60.0000 mg | SUBCUTANEOUS | Status: DC
  Administered 2020-10-07 – 2020-10-10 (×4): 60 mg via SUBCUTANEOUS
  Filled 2020-10-07 (×4): qty 0.6

## 2020-10-07 MED ORDER — METHYLPREDNISOLONE SODIUM SUCC 125 MG IJ SOLR: 80.0000 mg | Freq: Three times a day (TID) | INTRAMUSCULAR | Status: DC

## 2020-10-07 MED ORDER — METHYLPREDNISOLONE SODIUM SUCC 125 MG IJ SOLR
60.0000 mg | Freq: Three times a day (TID) | INTRAMUSCULAR | Status: DC
  Administered 2020-10-07 – 2020-10-08 (×3): 60 mg via INTRAVENOUS
  Filled 2020-10-07 (×3): qty 2

## 2020-10-07 MED ORDER — ASPIRIN EC 81 MG PO TBEC
81.0000 mg | DELAYED_RELEASE_TABLET | Freq: Every day | ORAL | Status: DC
  Administered 2020-10-07 – 2020-10-11 (×5): 81 mg via ORAL
  Filled 2020-10-07 (×5): qty 1

## 2020-10-07 MED ORDER — GUAIFENESIN ER 600 MG PO TB12: 1200.0000 mg | ORAL_TABLET | Freq: Two times a day (BID) | ORAL | Status: DC

## 2020-10-07 MED ORDER — INSULIN ASPART 100 UNIT/ML ~~LOC~~ SOLN
0.0000 [IU] | Freq: Three times a day (TID) | SUBCUTANEOUS | Status: DC
  Administered 2020-10-07: 15 [IU] via SUBCUTANEOUS

## 2020-10-07 MED ORDER — MONTELUKAST SODIUM 10 MG PO TABS
10.0000 mg | ORAL_TABLET | Freq: Every day | ORAL | Status: DC
  Administered 2020-10-07 – 2020-10-10 (×4): 10 mg via ORAL
  Filled 2020-10-07 (×5): qty 1

## 2020-10-07 MED ORDER — INSULIN ASPART 100 UNIT/ML ~~LOC~~ SOLN
0.0000 [IU] | Freq: Every day | SUBCUTANEOUS | Status: DC
  Administered 2020-10-07 – 2020-10-09 (×3): 5 [IU] via SUBCUTANEOUS
  Administered 2020-10-10: 2 [IU] via SUBCUTANEOUS

## 2020-10-07 MED ORDER — ASCORBIC ACID 500 MG PO TABS
500.0000 mg | ORAL_TABLET | Freq: Every day | ORAL | Status: DC
  Administered 2020-10-07 – 2020-10-11 (×5): 500 mg via ORAL
  Filled 2020-10-07 (×5): qty 1

## 2020-10-07 MED ORDER — ALBUTEROL SULFATE (2.5 MG/3ML) 0.083% IN NEBU: 2.5000 mg | INHALATION_SOLUTION | RESPIRATORY_TRACT | Status: DC | PRN

## 2020-10-07 MED ORDER — ACETAMINOPHEN 325 MG PO TABS
650.0000 mg | ORAL_TABLET | ORAL | Status: DC | PRN
  Administered 2020-10-07 – 2020-10-11 (×2): 650 mg via ORAL
  Filled 2020-10-07 (×2): qty 2

## 2020-10-07 MED ORDER — ALBUTEROL SULFATE (2.5 MG/3ML) 0.083% IN NEBU
2.5000 mg | INHALATION_SOLUTION | RESPIRATORY_TRACT | Status: DC | PRN
  Administered 2020-10-07 (×2): 2.5 mg via RESPIRATORY_TRACT

## 2020-10-07 MED ORDER — GABAPENTIN 300 MG PO CAPS
900.0000 mg | ORAL_CAPSULE | Freq: Three times a day (TID) | ORAL | Status: DC
Start: 2020-10-07 — End: 2020-10-11
  Administered 2020-10-07 – 2020-10-10 (×10): 900 mg via ORAL
  Administered 2020-10-10: 600 mg via ORAL
  Administered 2020-10-10: 900 mg via ORAL
  Administered 2020-10-10: 300 mg via ORAL
  Administered 2020-10-11: 900 mg via ORAL
  Filled 2020-10-07 (×14): qty 3

## 2020-10-07 MED ORDER — SPIRONOLACTONE 25 MG PO TABS
25.0000 mg | ORAL_TABLET | Freq: Every day | ORAL | Status: DC
  Administered 2020-10-07: 25 mg via ORAL
  Filled 2020-10-07 (×2): qty 1

## 2020-10-07 MED ORDER — PANTOPRAZOLE SODIUM 40 MG PO TBEC
40.0000 mg | DELAYED_RELEASE_TABLET | Freq: Every day | ORAL | Status: DC
  Administered 2020-10-07 – 2020-10-11 (×5): 40 mg via ORAL
  Filled 2020-10-07 (×5): qty 1

## 2020-10-07 MED ORDER — BENZONATATE 100 MG PO CAPS
100.0000 mg | ORAL_CAPSULE | Freq: Three times a day (TID) | ORAL | Status: DC | PRN
  Administered 2020-10-07 – 2020-10-10 (×6): 100 mg via ORAL
  Filled 2020-10-07 (×6): qty 1

## 2020-10-07 MED ORDER — IOHEXOL 350 MG/ML SOLN
60.0000 mL | Freq: Once | INTRAVENOUS | Status: AC | PRN
  Administered 2020-10-07: 60 mL via INTRAVENOUS

## 2020-10-07 MED ORDER — UMECLIDINIUM BROMIDE 62.5 MCG/INH IN AEPB
1.0000 | INHALATION_SPRAY | Freq: Every day | RESPIRATORY_TRACT | Status: DC
  Administered 2020-10-07 – 2020-10-11 (×2): 1 via RESPIRATORY_TRACT
  Filled 2020-10-07 (×2): qty 7

## 2020-10-07 MED ORDER — VITAMIN D 25 MCG (1000 UNIT) PO TABS
5000.0000 [IU] | ORAL_TABLET | Freq: Every day | ORAL | Status: DC
  Administered 2020-10-07 – 2020-10-11 (×5): 5000 [IU] via ORAL
  Filled 2020-10-07 (×5): qty 5

## 2020-10-07 MED ORDER — ADULT MULTIVITAMIN W/MINERALS CH
1.0000 | ORAL_TABLET | Freq: Every day | ORAL | Status: DC
  Administered 2020-10-07 – 2020-10-11 (×5): 1 via ORAL
  Filled 2020-10-07 (×5): qty 1

## 2020-10-07 MED ORDER — ENOXAPARIN SODIUM 120 MG/0.8ML ~~LOC~~ SOLN
120.0000 mg | Freq: Two times a day (BID) | SUBCUTANEOUS | Status: DC
  Filled 2020-10-07 (×2): qty 0.8

## 2020-10-07 MED ORDER — LORAZEPAM 2 MG/ML IJ SOLN
0.5000 mg | INTRAMUSCULAR | Status: DC | PRN
Start: 2020-10-07 — End: 2020-10-11
  Administered 2020-10-07: 1 mg via INTRAVENOUS
  Filled 2020-10-07: qty 1

## 2020-10-07 MED ORDER — INSULIN ASPART 100 UNIT/ML ~~LOC~~ SOLN
14.0000 [IU] | Freq: Once | SUBCUTANEOUS | Status: AC
  Administered 2020-10-07: 14 [IU] via SUBCUTANEOUS

## 2020-10-07 MED ORDER — LORATADINE 10 MG PO TABS
10.0000 mg | ORAL_TABLET | Freq: Every day | ORAL | Status: DC
  Administered 2020-10-07 – 2020-10-11 (×5): 10 mg via ORAL
  Filled 2020-10-07 (×5): qty 1

## 2020-10-07 MED ORDER — SODIUM CHLORIDE 0.9 % IV SOLN
1.0000 g | Freq: Every day | INTRAVENOUS | Status: DC
  Administered 2020-10-07 – 2020-10-10 (×4): 1 g via INTRAVENOUS
  Filled 2020-10-07 (×4): qty 10

## 2020-10-07 MED ORDER — IPRATROPIUM-ALBUTEROL 0.5-2.5 (3) MG/3ML IN SOLN
3.0000 mL | Freq: Four times a day (QID) | RESPIRATORY_TRACT | Status: DC
  Administered 2020-10-07 – 2020-10-09 (×7): 3 mL via RESPIRATORY_TRACT
  Filled 2020-10-07 (×9): qty 3

## 2020-10-07 MED ORDER — INSULIN ASPART 100 UNIT/ML ~~LOC~~ SOLN
0.0000 [IU] | Freq: Three times a day (TID) | SUBCUTANEOUS | Status: DC
  Administered 2020-10-07 (×2): 20 [IU] via SUBCUTANEOUS
  Administered 2020-10-08: 4 [IU] via SUBCUTANEOUS
  Administered 2020-10-08: 15 [IU] via SUBCUTANEOUS
  Administered 2020-10-08 – 2020-10-09 (×2): 20 [IU] via SUBCUTANEOUS
  Administered 2020-10-09: 15 [IU] via SUBCUTANEOUS
  Administered 2020-10-09: 20 [IU] via SUBCUTANEOUS
  Administered 2020-10-10: 3 [IU] via SUBCUTANEOUS
  Administered 2020-10-10: 7 [IU] via SUBCUTANEOUS
  Administered 2020-10-10: 11 [IU] via SUBCUTANEOUS
  Administered 2020-10-11: 4 [IU] via SUBCUTANEOUS
  Administered 2020-10-11: 7 [IU] via SUBCUTANEOUS

## 2020-10-07 MED ORDER — INSULIN GLARGINE 100 UNIT/ML ~~LOC~~ SOLN
15.0000 [IU] | Freq: Every day | SUBCUTANEOUS | Status: DC
  Administered 2020-10-07: 15 [IU] via SUBCUTANEOUS
  Filled 2020-10-07 (×2): qty 0.15

## 2020-10-07 MED ORDER — INSULIN GLARGINE 100 UNIT/ML ~~LOC~~ SOLN
10.0000 [IU] | Freq: Every day | SUBCUTANEOUS | Status: DC
Start: 2020-10-07 — End: 2020-10-07

## 2020-10-07 MED ORDER — AMLODIPINE BESYLATE 5 MG PO TABS
5.0000 mg | ORAL_TABLET | Freq: Two times a day (BID) | ORAL | Status: DC
  Administered 2020-10-07 – 2020-10-10 (×8): 5 mg via ORAL
  Filled 2020-10-07 (×8): qty 1

## 2020-10-07 MED ORDER — PRAVASTATIN SODIUM 40 MG PO TABS
40.0000 mg | ORAL_TABLET | Freq: Every day | ORAL | Status: DC
  Administered 2020-10-07 – 2020-10-10 (×4): 40 mg via ORAL
  Filled 2020-10-07 (×4): qty 1

## 2020-10-07 MED ORDER — GUAIFENESIN ER 600 MG PO TB12
1200.0000 mg | ORAL_TABLET | Freq: Two times a day (BID) | ORAL | Status: DC
  Administered 2020-10-07 – 2020-10-11 (×9): 1200 mg via ORAL
  Filled 2020-10-07 (×9): qty 2

## 2020-10-07 MED ORDER — INSULIN ASPART 100 UNIT/ML ~~LOC~~ SOLN: 0.0000 [IU] | Freq: Every day | SUBCUTANEOUS | Status: DC

## 2020-10-07 MED ORDER — PREDNISONE 20 MG PO TABS
40.0000 mg | ORAL_TABLET | Freq: Every day | ORAL | Status: DC
  Administered 2020-10-07: 40 mg via ORAL
  Filled 2020-10-07: qty 2

## 2020-10-07 MED ORDER — TRAZODONE HCL 50 MG PO TABS
100.0000 mg | ORAL_TABLET | Freq: Every day | ORAL | Status: DC
  Administered 2020-10-07 – 2020-10-10 (×4): 100 mg via ORAL
  Filled 2020-10-07 (×4): qty 2

## 2020-10-07 MED ORDER — FLUTICASONE PROPIONATE 50 MCG/ACT NA SUSP
2.0000 | Freq: Every day | NASAL | Status: DC
  Administered 2020-10-07: 2 via NASAL
  Filled 2020-10-07: qty 16

## 2020-10-07 MED ORDER — INSULIN ASPART 100 UNIT/ML ~~LOC~~ SOLN
5.0000 [IU] | Freq: Once | SUBCUTANEOUS | Status: AC
  Administered 2020-10-07: 5 [IU] via SUBCUTANEOUS

## 2020-10-07 MED ORDER — MOMETASONE FURO-FORMOTEROL FUM 200-5 MCG/ACT IN AERO
2.0000 | INHALATION_SPRAY | Freq: Two times a day (BID) | RESPIRATORY_TRACT | Status: DC
  Administered 2020-10-07: 2 via RESPIRATORY_TRACT
  Filled 2020-10-07: qty 8.8

## 2020-10-07 NOTE — ED Notes (Addendum)
Message sent to MD Janee Morn. "Good morning! Are you covering this patient? Her CBG was 494 this morning. Gave 15 units of insulin, but she is off the scale and may need another few units. (She is on prednisone.) Please advise."  Patient in chair in room, not wanting to lay in stretcher. She walked to the bathroom with a steady gait. Back in room and on monitors.

## 2020-10-07 NOTE — H&P (Signed)
History and Physical    Julia Henderson QIO:[id number] DOB: 27-Nov-1960 DOA: 10/06/2020  PCP: Patient, No Pcp Per  Patient coming from: Home  I have personally briefly reviewed patient's old medical records in [facility] Link  Chief Complaint: SOB, cough  HPI: Julia Henderson is a 60 y.o. female with medical history significant of COPD / chronic bronchitis, HTN, HLD, morbid obesity.  Pt admitted to [facility] on 2/5 with COVID-19.  Since that time she has had SOB, wearing up to 4L PRN at home.  For the past 4 days she has had cough, chills, body aches, headache.  Cough productive of yellow sputum.  She has had SOB during that time that has progressively worsened, now severe.  No abd pain, N/V/D, rash, syncope, orthopnea, leg swelling.  Does have new onset polyuria / polydipsia.  Not previously ever diagnosed with DM.   ED Course: Severe wheezing, improved some with neb treatments and steroids in ED.  BGL on BMP is 420.  (was in the 300s during ED visit last month too).  PT sating 100% on 4L, but remains wheezy and SOB.  D.Dimer 0.57.  Pt refused CT scan until daughter can be here in AM due to anxiety.  Review of Systems: As per HPI, otherwise all review of systems negative.  Past Medical History:  Diagnosis Date  . Bronchitis   . High cholesterol   . Hypertension     Past Surgical History:  Procedure Laterality Date  . CESAREAN SECTION       reports that she has never smoked. She has never used smokeless tobacco. She reports current alcohol use. She reports previous drug use.  Allergies  Allergen Reactions  . Celexa [Citalopram]     Family History  Problem Relation Age of Onset  . Lung disease Neg Hx      Prior to Admission medications   Medication Sig Start Date End Date Taking? Authorizing Provider  albuterol (VENTOLIN HFA) 108 (90 Base) MCG/ACT inhaler Inhale 2 puffs into the lungs every 4 (four) hours as needed for wheezing or shortness of breath. 09/14/20  Yes  Badalamente, Peter R, PA-C  amLODipine (NORVASC) 5 MG tablet Take 5 mg by mouth in the morning and at bedtime.   Yes [provider]  aspirin EC 81 MG tablet Take 81 mg by mouth daily. Swallow whole.   Yes [provider]  benzonatate (TESSALON) 100 MG capsule Take 1 capsule (100 mg total) by mouth every 8 (eight) hours as needed for cough. 09/14/20  Yes Haskel Schroeder, PA-C  cetirizine (ZYRTEC ALLERGY) 10 MG tablet Take 10 mg by mouth daily.   Yes [provider]  Cholecalciferol (VITAMIN D3) 125 MCG (5000 UT) TABS Take 5,000 Units by mouth daily.   Yes [provider]  gabapentin (NEURONTIN) 300 MG capsule Take 900 mg by mouth 3 (three) times daily.   Yes [provider]  lisinopril (ZESTRIL) 20 MG tablet Take 1 tablet (20 mg total) by mouth daily for 14 days. 09/14/20 09/28/20 Yes Badalamente, Rose Phi, PA-C  metoprolol tartrate (LOPRESSOR) 100 MG tablet Take 100 mg by mouth 2 (two) times daily.   Yes [provider]  montelukast (SINGULAIR) 10 MG tablet Take 10 mg by mouth at bedtime.   Yes [provider]  Multiple Vitamin (MULTIVITAMIN) tablet Take 1 tablet by mouth daily.   Yes [provider]  Omega-3 Fatty Acids (FISH OIL) 1000 MG CAPS Take 1,000 mg by mouth daily.   Yes  [provider]  pravastatin (PRAVACHOL) 40 MG tablet Take 40 mg by mouth at bedtime.   Yes [provider]  spironolactone (ALDACTONE) 25 MG tablet Take 25 mg by mouth daily.   Yes [provider]  traZODone (DESYREL) 100 MG tablet Take 100 mg by mouth at bedtime.   Yes [provider]  vitamin C (ASCORBIC ACID) 500 MG tablet Take 500 mg by mouth daily.   Yes [provider]    Physical Exam: Vitals:   10/07/20 0040 10/07/20 0100 10/07/20 0115 10/07/20 0200  BP:  (!) 126/93 111/67 (!) 124/102  Pulse: (!) 131 (!) 123 (!) 116 89  Resp:  17 20 14   Temp:      SpO2:  99% 100% 100%    Constitutional:  NAD, calm, comfortable Eyes: PERRL, lids and conjunctivae normal ENMT: Mucous membranes are moist. Posterior pharynx clear of any exudate or lesions.Normal dentition.  Neck: normal, supple, no masses, no thyromegaly Respiratory: diffuse wheezing Cardiovascular: Regular rate and rhythm, no murmurs / rubs / gallops. No extremity edema. 2+ pedal pulses. No carotid bruits.  Abdomen: no tenderness, no masses palpated. No hepatosplenomegaly. Bowel sounds positive.  Musculoskeletal: no clubbing / cyanosis. No joint deformity upper and lower extremities. Good ROM, no contractures. Normal muscle tone.  Skin: no rashes, lesions, ulcers. No induration Neurologic: CN 2-12 grossly intact. Sensation intact, DTR normal. Strength 5/5 in all 4.  Psychiatric: Normal judgment and insight. Alert and oriented x 3. Normal mood.    Labs on Admission: I have personally reviewed following labs and imaging studies  CBC: Recent Labs  Lab 10/06/20 1852 10/07/20 0207  WBC 7.4  --   HGB 12.7 14.3  HCT 42.6 42.0  MCV 96.6  --   PLT 223  --    Basic Metabolic Panel: Recent Labs  Lab 10/06/20 1852 10/07/20 0207  NA 137 138  K 4.5 3.8  CL 98  --   CO2 32  --   GLUCOSE 420*  --   BUN 13  --   CREATININE 0.82  --   CALCIUM 9.4  --    GFR: CrCl cannot be calculated (Unknown ideal weight.). Liver Function Tests: Recent Labs  Lab 10/07/20 0023  AST 62*  ALT 43  ALKPHOS 58  BILITOT 0.6  PROT 7.7  ALBUMIN 3.7   No results for input(s): LIPASE, AMYLASE in the last 168 hours. No results for input(s): AMMONIA in the last 168 hours. Coagulation Profile: No results for input(s): INR, PROTIME in the last 168 hours. Cardiac Enzymes: No results for input(s): CKTOTAL, CKMB, CKMBINDEX, TROPONINI in the last 168 hours. BNP (last 3 results) No results for input(s): PROBNP in the last 8760 hours. HbA1C: No results for input(s): HGBA1C in the last 72 hours. CBG: Recent Labs  Lab 10/07/20 0233  GLUCAP  203*   Lipid Profile: No results for input(s): CHOL, HDL, LDLCALC, TRIG, CHOLHDL, LDLDIRECT in the last 72 hours. Thyroid Function Tests: No results for input(s): TSH, T4TOTAL, FREET4, T3FREE, THYROIDAB in the last 72 hours. Anemia Panel: No results for input(s): VITAMINB12, FOLATE, FERRITIN, TIBC, IRON, RETICCTPCT in the last 72 hours. Urine analysis: No results found for: COLORURINE, APPEARANCEUR, LABSPEC, PHURINE, GLUCOSEU, HGBUR, BILIRUBINUR, KETONESUR, PROTEINUR, UROBILINOGEN, NITRITE, LEUKOCYTESUR  Radiological Exams on Admission: DG Chest 2 View  Result Date: 10/06/2020 CLINICAL DATA:  60 year old female with shortness of breath. EXAM: CHEST - 2 VIEW COMPARISON:  Chest radiograph dated 09/13/2020. FINDINGS: No focal consolidation, pleural effusion or pneumothorax.  Top-normal cardiac size. No acute osseous pathology. IMPRESSION: No active cardiopulmonary disease. Electronically Signed   By: Elgie Collard M.D.   On: 10/06/2020 19:35    EKG: Independently reviewed.  Assessment/Plan Principal Problem:   Acute respiratory failure with hypoxia (HCC) Active Problems:   HTN (hypertension)   Morbid obesity (HCC)   DM2 (diabetes mellitus, type 2) (HCC)    1. Acute respiratory failure with hypoxia - 1. DDx Bronchitis / COPD exacerbation vs PE vs post COVID pulmonary fibrosis 2. Pt refuses CT chest until her daughter can come to hospital and be with her in AM 3. In the mean time will treat empirically as bronchitis / COPD exacerbation 4. COPD pathway 5. Tele 6. PRN albuterol 7. Scheduled LABA/LAMA/inh steroid 8. Prednisone 9. Adult wheeze 10. Cont pulse ox 11. PH neutral, no ACUTE hypercapnea on ABG 2. HTN - 1. Cont home BP meds 3. DM2 - new diagnosis 1. Mod scale SSI AC/HS 2. Diabetes coordinator consult 3. Steroids being used for #1 wont help this any  DVT prophylaxis: Lovenox Code Status: Full Family Communication: No family in room Disposition Plan: Home after  breathing improved Consults called: None Admission status: Place in obs   Kseniya Grunden M. DO [facility]  How to contact the [facility] Attending or Consulting provider 7A - 7P or covering provider during after hours 7P -7A, for this patient?  1. Check the care team in [facility] and look for a) attending/consulting [facility] provider listed and b) the [facility] team listed 2. Log into [email or web address]  Amion Physician Scheduling and messaging for groups and whole hospitals  On call and physician scheduling software for group practices, residents, hospitalists and other medical providers for call, clinic, rotation and shift schedules. OnCall Enterprise is a hospital-wide system for scheduling doctors and paging doctors on call. EasyPlot is for scientific plotting and data analysis.  [email or web address]  and use [place]'s universal password to access. If you do not have the password, please contact the hospital operator.  3. Locate the [facility] provider you are looking for under [facility] and page to a number that you can be directly reached. 4. If you still have difficulty reaching the provider, please page the [facility] (Director on Call) for the Hospitalists listed on amion for assistance.  10/07/2020, 2:37 AM

## 2020-10-07 NOTE — ED Notes (Signed)
Offered pt hospital bed while waiting in the ED, pt refused

## 2020-10-07 NOTE — Discharge Instructions (Signed)
Glucose Products:  ReliOnT glucose products raise low blood sugar fast. Tablets are free of fat, caffeine, sodium and gluten. They are portable and easy to carry, making it easier for people with diabetes to BE PREPARED for lows. Glucose Tablets Available in 6 flavors . 10 ct...................................... $1.00 . 50 ct...................................... $3.98 Glucose Shot..................................$1.48 Glucose Gel....................................$3.44  Alcohol Swabs Alcohol swabs are used to sterilize your injection site. All of our swabs are individually wrapped for maximum safety, convenience and moisture retention. ReliOnT Alcohol Swabs . 100 ct Swabs..............................$1.00 . 400 ct Swabs..............................$3.74  Lancets ReliOnT offers three lancet options conveniently designed to work with almost every lancing device. Each features a protective disk, which guarantees sterility before testing. ReliOnT Lancets . 100 ct Lancets $1.56 . 200 ct Lancets $2.64 Available in Ultra-Thin, Thin & Micro-Thin ReliOnT 2-IN-1 Lancing Device . 50 ct Lancets..................................... $3.44 Available in 30 gauge and 25 gauge ReliOnT Lancing Device....................$5.84  Blood Glucose Monitors ReliOnT offers a full range of blood glucose testing options to provide an accurate, affordable system that meets each person's unique needs and preferences. Prime Meter....................................... $9.00 Prime Test Strips . 25 test strips.................................... $5.00 . 50 test strips.................................... $9.00 . 100 test strips.................................$17.88 Premier BLU Meter  ............  $18.98 Premier Voice Meter  .............  $14.98 Premier Test Strips . 50 test strips.................................... $9.00 . 100 test strips.................................$17.88 Premier [employer]   ............  $19.44 Kit includes: . 50 test strips . 10 lancets . Lancing device . Carry case  Ketone Test Strips . 50 test strips  ................  $6.64  Human Insulin  Novolin/ReliOnT (recombinant DNA origin) is manufactured for [employer] by [employer] Insulin* with Vial..........$24.88 Available in N, R, 70/30 Novolin/ReliOnT Insulin Pens*  .....  $42.88 Available in 70/30 only  Insulin Delivery ReliOnT syringes and pen needles provide precision technology, comfort and accuracy in insulin delivery at affordable prices. ReliOnT Pen Needles* . 50 ct....................................................$9.00 Available in 260m, 666m 60m57m 36m84mliOnT Insulin Syringes* . 100 ct ............ $12.58 Available in 29G, 30G & 31G (3/10cc, 1/2cc & 1cc units)

## 2020-10-07 NOTE — ED Notes (Signed)
ED Provider at bedside. 

## 2020-10-07 NOTE — ED Notes (Signed)
Called lab, they are to send viral swab to ED for resp. Panel.

## 2020-10-07 NOTE — ED Notes (Signed)
IV team at bedside 

## 2020-10-07 NOTE — ED Notes (Signed)
CT called and Rn assessed patient's willingness to get CTA. Patient offered her dose of ativan and higher flow of O2. Patient refusing at this time and verbalizes she understands the risks of delaying this test. Will let CT know

## 2020-10-07 NOTE — ED Notes (Addendum)
Patient refused CT scan, McDonald, PA made aware of same. RT at bedside attempting ABG collection.

## 2020-10-07 NOTE — Evaluation (Signed)
Physical Therapy Evaluation Patient Details Name: Julia Henderson MRN: [id number] DOB: [birth year] Today's Date: 10/07/2020   History of Present Illness  60 yo female with onset of acute respiratory failure on chronic home O2 was brought to ED.  Pt is SOB even for baseline, new polyuria, coughing and has HA with productive cough.  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  PMHx:  bronchitis, HTN, COPD, Covid,  Clinical Impression  Pt was seen for mobility on her bedside where pt is sitting in a chair.  Her SOB and need for supplemental O2 preceded this visit but is worsened now with desaturation with only short trips on walker.  Follow up with her to monitor these findings, as her pregait sats were 96%, then down to 84% after walk and finally 95% with rest.  Continue on with her mobility and focus on need for lighter assistive device, as rollator recommended to support her efforts and give pt a place to sit down during longer walks.  See for goals of acute PT.    Follow Up Recommendations Home health PT;Supervision for mobility/OOB    Equipment Recommendations  Other (comment) (Rollator walker)    Recommendations for Other Services       Precautions / Restrictions Precautions Precautions: Fall Precaution Comments: monitor O2 sats Restrictions Weight Bearing Restrictions: No Other Position/Activity Restrictions: standing postures desat quickly      Mobility  Bed Mobility               General bed mobility comments: up in chair when PT arrived    Transfers Overall transfer level: Needs assistance Equipment used: 1 person hand held assist Transfers: Sit to/from Stand Sit to Stand: Supervision;Min guard         General transfer comment: min guard for safety and security  Ambulation/Gait Ambulation/Gait assistance: Min guard Gait Distance (Feet): 12 Feet Assistive device: 1 person hand held assist Gait Pattern/deviations: Step-to  pattern;Step-through pattern;Decreased stride length;Wide base of support Gait velocity: reduced Gait velocity interpretation: <1.31 ft/sec, indicative of household ambulator General Gait Details: short trip in room with 4L O2, desaturated to 89% quickly  Stairs            Wheelchair Mobility    Modified Rankin (Stroke Patients Only)       Balance Overall balance assessment: Needs assistance Sitting-balance support: Feet supported Sitting balance-Leahy Scale: Good     Standing balance support: No upper extremity supported Standing balance-Leahy Scale: Fair                               Pertinent Vitals/Pain Pain Assessment: No/denies pain    Home Living Family/patient expects to be discharged to:: Private residence Living Arrangements: Alone;Children Available Help at Discharge: Family;Available 24 hours/day Type of Home: House Home Access: Stairs to enter Entrance Stairs-Rails: Right Entrance Stairs-Number of Steps: 2 Home Layout: One level Home Equipment: Walker - 2 wheels;Cane - single point Additional Comments: may benefit from Rollator to sit and rest, transport O2    Prior Function Level of Independence: Independent with assistive device(s)         Comments: walked with O2 and alone     Hand Dominance   Dominant Hand: Right    Extremity/Trunk Assessment   Upper Extremity Assessment Upper Extremity Assessment: Generalized weakness    Lower Extremity Assessment Lower Extremity Assessment: Generalized weakness    Cervical / Trunk Assessment Cervical /  Trunk Assessment: Kyphotic (mild)  Communication   Communication: No difficulties  Cognition Arousal/Alertness: Awake/alert Behavior During Therapy: WFL for tasks assessed/performed Overall Cognitive Status: Within Functional Limits for tasks assessed                                 General Comments: pt is problem solving with PT about home      General  Comments General comments (skin integrity, edema, etc.): pt was able to balance wiht no AD but feels weak, better with UE support and recommending rollator    Exercises     Assessment/Plan    PT Assessment Patient needs continued PT services  PT Problem List Decreased strength;Decreased balance;Decreased mobility;Cardiopulmonary status limiting activity       PT Treatment Interventions DME instruction;Gait training;Stair training;Functional mobility training;Therapeutic activities;Therapeutic exercise;Balance training;Neuromuscular re-education;Patient/family education    PT Goals (Current goals can be found in the Care Plan section)  Acute Rehab PT Goals Patient Stated Goal: to walk and get stronger again PT Goal Formulation: With patient/family Time For Goal Achievement: 10/21/20 Potential to Achieve Goals: Good    Frequency Min 3X/week   Barriers to discharge Decreased caregiver support home with family most of the time, stairs to enter    Co-evaluation               AM-PAC PT "6 Clicks" Mobility  Outcome Measure Help needed turning from your back to your side while in a flat bed without using bedrails?: A Little Help needed moving from lying on your back to sitting on the side of a flat bed without using bedrails?: A Little Help needed moving to and from a bed to a chair (including a wheelchair)?: A Little Help needed standing up from a chair using your arms (e.g., wheelchair or bedside chair)?: A Little Help needed to walk in hospital room?: A Little Help needed climbing 3-5 steps with a railing? : A Lot 6 Click Score: 17    End of Session Equipment Utilized During Treatment: Oxygen Activity Tolerance: Patient limited by fatigue;Treatment limited secondary to medical complications (Comment) Patient left: in chair;with call bell/phone within reach;with family/visitor present Nurse Communication: Mobility status PT Visit Diagnosis: Unsteadiness on feet  (R26.81);Dizziness and giddiness (R42);Muscle weakness (generalized) (M62.81)    Time: 5361-4431 PT Time Calculation (min) (ACUTE ONLY): 30 min   Charges:   PT Evaluation $PT Eval Moderate Complexity: 1 Mod PT Treatments $Gait Training: 8-22 mins       Ivar Drape 10/07/2020, 3:41 PM  Samul Dada, PT MS Acute Rehab Dept. Number: [facility] [id number] and [facility] [phone]

## 2020-10-07 NOTE — ED Notes (Signed)
Patient continues to refuse CT scan until the morning when her daughter is here despite multiple providers discussing importance of same.

## 2020-10-07 NOTE — ED Notes (Signed)
Hospitalist at bedside 

## 2020-10-07 NOTE — ED Notes (Signed)
Patient provided with bagged meal and diet soda per her request. Feeding self without issue at this time.

## 2020-10-07 NOTE — Progress Notes (Addendum)
Inpatient Diabetes Program Recommendations  AACE/ADA: New Consensus Statement on Inpatient Glycemic Control (2015)  Target Ranges:  Prepandial:   less than 140 mg/dL      Peak postprandial:   less than 180 mg/dL (1-2 hours)      Critically ill patients:  140 - 180 mg/dL   Results for Julia Henderson, Julia Henderson (MRN [id number]) as of 10/07/2020 06:53  Ref. Range 10/06/2020 18:52  Sodium Latest Ref Range: 135 - 145 mmol/L 137  Potassium Latest Ref Range: 3.5 - 5.1 mmol/L 4.5  Chloride Latest Ref Range: 98 - 111 mmol/L 98  CO2 Latest Ref Range: 22 - 32 mmol/L 32  Glucose Latest Ref Range: 70 - 99 mg/dL 420 (H)  BUN Latest Ref Range: 8 - 23 mg/dL 13  Creatinine Latest Ref Range: 0.44 - 1.00 mg/dL 0.82  Calcium Latest Ref Range: 8.9 - 10.3 mg/dL 9.4  Anion gap Latest Ref Range: 5 - 15  7   Results for Julia Henderson, Julia Henderson (MRN [id number]) as of 10/07/2020 06:53  Ref. Range 10/07/2020 02:33  Glucose-Capillary Latest Ref Range: 70 - 99 mg/dL 203 (H)  125 mg Solumedrol _0 :07am   Results for Julia Henderson, Julia Henderson (MRN [id number]) as of 10/07/2020 06:53  Ref. Range 10/07/2020 02:28  Hemoglobin A1C Latest Ref Range: 4.8 - 5.6 % 10.3 (H)  (248 mg/dl)    Admit with: Acute respiratory failure with hypoxia/ DM2 - new diagnosis COPD exacerbation vs PE vs post COVID pulmonary fibrosis  History: COPD  Current Orders: Novolog Moderate Correction Scale/ SSI (0-15 units) TID AC + HS   Prednisone 40 mg Daily  New Diagnosis of Diabetes--No PCP listed--No Insurance listed--Plan to consult TOC team  Novolog SSi to start at [place] today  Will order educational materials and plan to speak with pt about new diagnosis   Addendum 11:50am--Met w/ pt down in the ED to discuss admission glucose, Current A1c, New diabetes diagnosis.  Pt was concerned about her glucose--she stated to me that all the steroids she has received over the past month has caused issues with her glucose levels and that she doesn't want to be labeled as a  diabetic until her glucose levels have been re-evaluated after being off the steroids for several weeks.  Pt told me she went on disability and stopped working as a Therapist, sports about 2 years ago.  Gained about 55 pounds after stopping her job.  Moved to  last August (2021) and just last week got established with PCP here in [place] last week.  Told me her PCP did not say anything about her glucose levels at last week's visit and cannot remember what her last A1c level was.  Pt told me she had COVID in early February and was given IV Steroids in the hospital (she thinks Solumedrol) and then was given Decadron to take daily for 5 days after discharge.  Came back to [facility] ED about 3 weeks after she was discharged from [facility] and was given Solumedrol again and sent home with Prednisone 50 mg Daily.  Pt again stated to me that she wants to be off the steroids for several weeks before her glucose levels are re-evaluated and then if they are elevated she will accept a diabetes diagnosis.  Discussed with pt that given her older age, obesity, recent steroid use, and COVID diagnosis that she has several major risk factors at play that can lead to diabetes.  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  Knows how to check her CBGs and asked me if she should start checking CBGs at home.  I strongly encouraged pt to go to [facility] and purchase a CBG meter OTC and begin checking CBGs at least TID [facility] (an occasionally after meals as well).  We reviewed normal/goal CBGs for home, normal/goal A1c.  Gave pt 2 educational pamphlets on diabetes basics and better diabetes nutrition/meal planning for home.  Plan to order Living Well with diabetes book for pt when she gets a room assignment.  Strongly encouraged pt to follow up with her PCP after discharge and to make sure she has her A1c retested about 2-3 months after her last dose of steroids at home.  Currently pt  aware of her glucose levels and willing to take insulin in hospital and open to info about hyperglycemia, however, currently is not willing to accept a diagnosis of diabetes at this time.    --Will follow patient during hospitalization--  Wyn Quaker RN, MSN, CDE Diabetes Coordinator Inpatient Glycemic Control Team Team Pager: [phone] (8a-5p)

## 2020-10-07 NOTE — Plan of Care (Signed)

## 2020-10-07 NOTE — Progress Notes (Signed)
Two RT's attempted ABG multiple times and was unsuccessful with obtaining sample. MD will be notified.

## 2020-10-07 NOTE — Progress Notes (Addendum)
PROGRESS NOTE    Julia Henderson  QQV:[id number] DOB: 16-Aug-1960 DOA: 10/06/2020 PCP: Patient, No Pcp Per    Chief Complaint  Patient presents with  . Shortness of Breath    Brief Narrative:  Patient 60 year old morbidly obese female history of COPD/chronic bronchitis, hypertension, hyperlipidemia, recent hospitalization 08/24/2020 at [facility] for COVID-19 infection noted at time with shortness of breath and discharged home on 4 L home O2 as needed. Patient presented to the ED with a 4-day history of productive cough of greenish sputum, body aches, chills, headache, worsening shortness of breath which has progressed. On presentation to the ED patient noted with severe wheezing with some improvement with nebulizer treatments and steroids.  Blood glucose level noted to be 420.  D-dimer elevated at 0.57. CT angiogram chest ordered however patient refused until daughter could be here due to anxiety. Patient admitted for acute COPD exacerbation versus post Covid pulmonary fibrosis versus PE.Marland Kitchen   Assessment & Plan:   Principal Problem:   Acute respiratory failure with hypoxia (HCC) Active Problems:   HTN (hypertension)   Morbid obesity (HCC)   DM2 (diabetes mellitus, type 2) (HCC)  #1 acute respiratory failure with hypoxia Differential includes acute COPD exacerbation versus PE versus post Covid pulmonary fibrosis. -D-dimer elevated, CT angiogram chest ordered however patient refused until daughter able to come to the hospital to be with her prior to obtaining CT. -Patient with some clinical improvement. - Place on Pulmicort nebs twice daily, scheduled duo nebs, Claritin, Flonase, PPI. -DC oral prednisone and placed on IV Solu-Medrol 60 mg every 8 hours with taper. -Continue IV Rocephin. -Patient on full dose Lovenox pending CT angiogram results and if negative will change back to prophylactic dose Lovenox. -Supportive care.  2.  Hypertension Continue home regimen Norvasc 5 mg  daily.  3.  Newly diagnosed diabetes mellitus type 2 -Patient states does not have a history of diabetes. -Hemoglobin A1c 10.3 (10/07/2020). -Patient with elevated blood glucose level of 494 this morning. -Placed on Lantus 15 units daily -Change sliding scale insulin to resistant scale insulin. -Placed on NovoLog 4 units 3 times daily meal coverage. -Consult with diabetic coordinator.  4.  UTI -Urinalysis with moderate leukocytes, nitrite negative, WBC > 50 -Check a urine culture -On IV Rocephin.  5.  Morbid obesity    DVT prophylaxis: Lovenox Code Status: Full Family Communication: Updated patient.  No family at bedside. Disposition:   Status is: Inpatient    Dispo: The patient is from: Home              Anticipated d/c is to: Home              Patient currently on IV antibiotics, IV steroids, nebulizer treatments, CT angiogram chest pending.  Not stable for discharge.   Difficult to place patient now       Consultants:   None  Procedures:  CT angiogram chest pending 10/07/2020  Chest x-ray 10/06/2020    Antimicrobials:   IV Rocephin 10/07/2020>>>>   Subjective: Patient sitting up in chair.  States some improvement with shortness of breath since admission.  Feels with diffuse wheezing has improved.  Complains of cough of greenish sputum.  Denies any chest pain.  Objective: Vitals:   10/07/20 0830 10/07/20 0832 10/07/20 0932 10/07/20 0945  BP:   133/68 138/71  Pulse: (!) 101  95 96  Resp: (!) 26   (!) 24  Temp: 98 F (36.7 C)     TempSrc: Oral  SpO2: 92%   91%  Weight:  121.8 kg     No intake or output data in the 24 hours ending 10/07/20 1112 Filed Weights   10/07/20 0832  Weight: 121.8 kg    Examination:  General exam: Appears calm and comfortable  Respiratory system: Decreased breath sounds in the bases.  Decreased expiratory wheezing.  No rhonchi.  No crackles.   Cardiovascular system: Regular rate rhythm no murmurs rubs or gallops.   Distant heart sounds due to body habitus.  No lower extremity pitting edema. Gastrointestinal system: Abdomen is obese, soft, nondistended, nontender, positive bowel sounds.  No rebound.  No guarding.  Central nervous system: Alert and oriented. No focal neurological deficits. Extremities: Symmetric 5 x 5 power. Skin: No rashes, lesions or ulcers Psychiatry: Judgement and insight appear normal. Mood & affect appropriate.     Data Reviewed: I have personally reviewed following labs and imaging studies  CBC: Recent Labs  Lab 10/06/20 1852 10/07/20 0207 10/07/20 0425  WBC 7.4  --  11.8*  HGB 12.7 14.3 13.4  HCT 42.6 42.0 44.3  MCV 96.6  --  96.7  PLT 223  --  247    Basic Metabolic Panel: Recent Labs  Lab 10/06/20 1852 10/07/20 0207 10/07/20 0425  NA 137 138 135  K 4.5 3.8 4.5  CL 98  --  97*  CO2 32  --  26  GLUCOSE 420*  --  398*  BUN 13  --  16  CREATININE 0.82  --  1.22*  CALCIUM 9.4  --  9.1    GFR: Estimated Creatinine Clearance: 59.2 mL/min (A) (by C-G formula based on SCr of 1.22 mg/dL (H)).  Liver Function Tests: Recent Labs  Lab 10/07/20 0023  AST 62*  ALT 43  ALKPHOS 58  BILITOT 0.6  PROT 7.7  ALBUMIN 3.7    CBG: Recent Labs  Lab 10/07/20 0233 10/07/20 0741  GLUCAP 203* 494*     Recent Results (from the past 240 hour(s))  Respiratory (~20 pathogens) panel by PCR     Status: Abnormal   Collection Time: 10/07/20  3:14 AM   Specimen: Nasopharyngeal Swab; Respiratory  Result Value Ref Range Status   Adenovirus NOT DETECTED NOT DETECTED Final   Coronavirus 229E NOT DETECTED NOT DETECTED Final    Comment: (NOTE) The Coronavirus on the Respiratory Panel, DOES NOT test for the novel  Coronavirus (2019 nCoV)    Coronavirus HKU1 NOT DETECTED NOT DETECTED Final   Coronavirus NL63 NOT DETECTED NOT DETECTED Final   Coronavirus OC43 NOT DETECTED NOT DETECTED Final   Metapneumovirus NOT DETECTED NOT DETECTED Final   Rhinovirus / Enterovirus  DETECTED (A) NOT DETECTED Final   Influenza A NOT DETECTED NOT DETECTED Final   Influenza B NOT DETECTED NOT DETECTED Final   Parainfluenza Virus 1 NOT DETECTED NOT DETECTED Final   Parainfluenza Virus 2 NOT DETECTED NOT DETECTED Final   Parainfluenza Virus 3 NOT DETECTED NOT DETECTED Final   Parainfluenza Virus 4 NOT DETECTED NOT DETECTED Final   Respiratory Syncytial Virus NOT DETECTED NOT DETECTED Final   Bordetella pertussis NOT DETECTED NOT DETECTED Final   Bordetella Parapertussis NOT DETECTED NOT DETECTED Final   Chlamydophila pneumoniae NOT DETECTED NOT DETECTED Final   Mycoplasma pneumoniae NOT DETECTED NOT DETECTED Final    Comment: Performed at [facility], [address], [place], [place] [address]         Radiology Studies: DG Chest 2 View  Result Date:  10/06/2020 CLINICAL DATA:  60 year old female with shortness of breath. EXAM: CHEST - 2 VIEW COMPARISON:  Chest radiograph dated 09/13/2020. FINDINGS: No focal consolidation, pleural effusion or pneumothorax. Top-normal cardiac size. No acute osseous pathology. IMPRESSION: No active cardiopulmonary disease. Electronically Signed   By: Elgie Collard M.D.   On: 10/06/2020 19:35        Scheduled Meds: . amLODipine  5 mg Oral BID  . vitamin C  500 mg Oral Daily  . aspirin EC  81 mg Oral Daily  . budesonide (PULMICORT) nebulizer solution  0.5 mg Nebulization BID  . cholecalciferol  5,000 Units Oral Daily  . enoxaparin (LOVENOX) injection  120 mg Subcutaneous Q12H  . fluticasone  2 spray Each Nare Daily  . gabapentin  900 mg Oral TID  . guaiFENesin  1,200 mg Oral BID  . insulin aspart  0-20 Units Subcutaneous TID WC  . insulin aspart  0-5 Units Subcutaneous QHS  . insulin glargine  15 Units Subcutaneous QHS  . ipratropium-albuterol  3 mL Nebulization Q6H  . loratadine  10 mg Oral Daily  . methylPREDNISolone (SOLU-MEDROL) injection  80 mg Intravenous Q8H  . metoprolol tartrate  100 mg Oral BID  .  mometasone-formoterol  2 puff Inhalation BID  . montelukast  10 mg Oral QHS  . multivitamin with minerals  1 tablet Oral Daily  . omega-3 acid ethyl esters  1,000 mg Oral Daily  . pantoprazole  40 mg Oral Q0600  . pravastatin  40 mg Oral QHS  . spironolactone  25 mg Oral Daily  . traZODone  100 mg Oral QHS  . umeclidinium bromide  1 puff Inhalation Daily   Continuous Infusions: . cefTRIAXone (ROCEPHIN)  IV Stopped (10/07/20 0353)     LOS: 0 days    Time spent: 45 minutes  No charge.    Ramiro Harvest, MD [facility]   To contact the attending provider between 7A-7P or the covering provider during after hours 7P-7A, please log into the web site [email or web address] and access using universal [place] password for that web site. If you do not have the password, please call the hospital operator.  10/07/2020, 11:12 AM

## 2020-10-07 NOTE — ED Notes (Signed)
Patient transported to CT 

## 2020-10-08 DIAGNOSIS — E875 Hyperkalemia: Secondary | ICD-10-CM

## 2020-10-08 LAB — CBC WITH DIFFERENTIAL/PLATELET
Abs Immature Granulocytes: 0.19 K/uL — ABNORMAL HIGH (ref 0.00–0.07)
Basophils Absolute: 0 K/uL (ref 0.0–0.1)
Basophils Relative: 0 %
Eosinophils Absolute: 0 K/uL (ref 0.0–0.5)
Eosinophils Relative: 0 %
HCT: 43.3 % (ref 36.0–46.0)
Hemoglobin: 13 g/dL (ref 12.0–15.0)
Immature Granulocytes: 2 %
Lymphocytes Relative: 9 %
Lymphs Abs: 1.1 K/uL (ref 0.7–4.0)
MCH: 28.8 pg (ref 26.0–34.0)
MCHC: 30 g/dL (ref 30.0–36.0)
MCV: 96 fL (ref 80.0–100.0)
Monocytes Absolute: 0.9 K/uL (ref 0.1–1.0)
Monocytes Relative: 7 %
Neutro Abs: 10.3 K/uL — ABNORMAL HIGH (ref 1.7–7.7)
Neutrophils Relative %: 82 %
Platelets: 264 K/uL (ref 150–400)
RBC: 4.51 MIL/uL (ref 3.87–5.11)
RDW: 18.4 % — ABNORMAL HIGH (ref 11.5–15.5)
WBC: 12.5 K/uL — ABNORMAL HIGH (ref 4.0–10.5)
nRBC: 0.3 % — ABNORMAL HIGH (ref 0.0–0.2)

## 2020-10-08 LAB — BASIC METABOLIC PANEL
Anion gap: 10 (ref 5–15)
BUN: 33 mg/dL — ABNORMAL HIGH (ref 8–23)
CO2: 30 mmol/L (ref 22–32)
Calcium: 9.7 mg/dL (ref 8.9–10.3)
Chloride: 94 mmol/L — ABNORMAL LOW (ref 98–111)
Creatinine, Ser: 1.28 mg/dL — ABNORMAL HIGH (ref 0.44–1.00)
GFR, Estimated: 48 mL/min — ABNORMAL LOW (ref >60)
Glucose, Bld: 324 mg/dL — ABNORMAL HIGH (ref 70–99)
Potassium: 5.2 mmol/L — ABNORMAL HIGH (ref 3.5–5.1)
Sodium: 134 mmol/L — ABNORMAL LOW (ref 135–145)

## 2020-10-08 LAB — GLUCOSE, CAPILLARY
Glucose-Capillary: 364 mg/dL — ABNORMAL HIGH (ref 70–99)
Glucose-Capillary: 334 mg/dL — ABNORMAL HIGH (ref 70–99)
Glucose-Capillary: 178 mg/dL — ABNORMAL HIGH (ref 70–99)
Glucose-Capillary: 377 mg/dL — ABNORMAL HIGH (ref 70–99)

## 2020-10-08 LAB — URINE CULTURE

## 2020-10-08 LAB — MAGNESIUM: Magnesium: 1.9 mg/dL (ref 1.7–2.4)

## 2020-10-08 MED ORDER — INSULIN ASPART 100 UNIT/ML ~~LOC~~ SOLN
8.0000 [IU] | Freq: Three times a day (TID) | SUBCUTANEOUS | Status: DC
  Administered 2020-10-08 – 2020-10-11 (×11): 8 [IU] via SUBCUTANEOUS

## 2020-10-08 MED ORDER — INSULIN GLARGINE 100 UNIT/ML ~~LOC~~ SOLN
20.0000 [IU] | Freq: Every day | SUBCUTANEOUS | Status: DC
  Administered 2020-10-08: 20 [IU] via SUBCUTANEOUS
  Filled 2020-10-08: qty 0.2

## 2020-10-08 MED ORDER — INSULIN GLARGINE 100 UNIT/ML ~~LOC~~ SOLN
25.0000 [IU] | Freq: Every day | SUBCUTANEOUS | Status: DC
  Administered 2020-10-09 – 2020-10-11 (×3): 25 [IU] via SUBCUTANEOUS
  Filled 2020-10-08 (×3): qty 0.25

## 2020-10-08 MED ORDER — INSULIN GLARGINE 100 UNIT/ML ~~LOC~~ SOLN: 20.0000 [IU] | Freq: Every day | SUBCUTANEOUS | Status: DC

## 2020-10-08 MED ORDER — METHYLPREDNISOLONE SODIUM SUCC 125 MG IJ SOLR
60.0000 mg | Freq: Two times a day (BID) | INTRAMUSCULAR | Status: DC
  Administered 2020-10-08 – 2020-10-09 (×2): 60 mg via INTRAVENOUS
  Filled 2020-10-08 (×2): qty 2

## 2020-10-08 MED ORDER — LIVING WELL WITH DIABETES BOOK
Freq: Once | Status: AC
  Filled 2020-10-08: qty 1

## 2020-10-08 MED ORDER — INSULIN GLARGINE 100 UNIT/ML ~~LOC~~ SOLN
5.0000 [IU] | Freq: Once | SUBCUTANEOUS | Status: AC
  Administered 2020-10-08: 5 [IU] via SUBCUTANEOUS
  Filled 2020-10-08: qty 0.05

## 2020-10-08 MED ORDER — PNEUMOCOCCAL VAC POLYVALENT 25 MCG/0.5ML IJ INJ
0.5000 mL | INJECTION | INTRAMUSCULAR | Status: DC
  Filled 2020-10-08: qty 0.5

## 2020-10-08 NOTE — TOC Initial Note (Addendum)
Transition of Care [facility]) - Initial/Assessment Note    Patient Details  Name: Julia Henderson: [id number] Date of Birth: 1961-04-23  [facility] CM/SW Contact:    Julia Guthrie, RN Phone Number: 10/08/2020, 9:19 AM  Clinical Narrative:  Patient is a  60 yo female with onset of acute respiratory failure on chronic home O2. She lives home with her daughter, Julia Henderson. CM spoke with patient's daughter concerning recommendation for Home PT. Patient is uninsured, referral called Julia Henderson, Julia Henderson [facility] Liaison. Julia Henderson is very thankful that her mom will be getting a rollator. TOC Team will continue to monitor for needs.    9:35am CM received call from Julia Henderson stating that patient lives outside of the [facility] areas,therefore  Julia Henderson will not be able to provide HHPT. CM  Notified patient and her daughter of same.    Patient Goals and CMS Choice        Expected Discharge Plan and Services Expected Discharge Plan: Home w Home Health Services   Discharge Planning Services: CM Consult Post Acute Care Choice: Durable Medical Equipment,Home Health Living arrangements for the past 2 months: Single Family Home                 DME Arranged: Walker rolling with seat DME Agency: [employer] Date DME Agency Contacted: 10/08/20 Time DME Agency Contacted: 0900 Representative spoke with at DME Agency: Julia Henderson HH Arranged: PT HH Agency: [facility] Date [facility] Agency Contacted: 10/08/20 Time HH Agency Contacted: [phone] Representative spoke with at [facility] Agency: Julia Henderson  Prior Living Arrangements/Services Living arrangements for the past 2 months: Single Family Home Lives with:: Adult Children   Do you feel safe going back to the place where you live?: Yes          Current home services: Other (comment),DME (oxygen)    Activities of Daily Living Home Assistive Devices/Equipment: None ADL Screening (condition at time of admission) Patient's cognitive  ability adequate to safely complete daily activities?: Yes Is the patient deaf or have difficulty hearing?: No Does the patient have difficulty seeing, even when wearing glasses/contacts?: No Does the patient have difficulty concentrating, remembering, or making decisions?: No Patient able to express need for assistance with ADLs?: Yes Does the patient have difficulty dressing or bathing?: Yes Independently performs ADLs?: Yes (appropriate for developmental age) Does the patient have difficulty walking or climbing stairs?: Yes Weakness of Legs: Both Weakness of Arms/Hands: Both  Permission Sought/Granted                  Emotional Assessment       Orientation: : Oriented to Self,Oriented to Place,Oriented to  Time,Oriented to Situation Alcohol / Substance Use: Not Applicable Psych Involvement: No (comment)  Admission diagnosis:  Shortness of breath [R06.02] Hyperglycemia [R73.9] Acute respiratory failure with hypoxia (Julia Henderson) [J96.01] Patient Active Problem List   Diagnosis Date Noted  . Acute respiratory failure with hypoxia (Julia Henderson) 10/07/2020  . HTN (hypertension) 10/07/2020  . Morbid obesity (Julia Henderson) 10/07/2020  . DM2 (diabetes mellitus, type 2) (Julia Henderson) 10/07/2020   PCP:  Patient, No Pcp Per Pharmacy:   Karin Golden at [facility] [address], [place] - [address] [facility] [address] [place] [address] Phone: [phone] Fax: [phone]     Social Determinants of Health (SDOH) Interventions    Readmission Risk Interventions No flowsheet data found.

## 2020-10-08 NOTE — Progress Notes (Addendum)
OT Cancellation Note  Patient Details Name: Julia Henderson MRN: [id number] DOB: 1960-08-26   Cancelled Treatment:    Reason Eval/Treat Not Completed: Other (comment) (Returned back to bed after mobility to/from bathroom. Pt with increased fatigue and tremors (BUE/BLE). Will return as schedule allows.)   Second attempt @ 1428: Pt fast asleep in bed. Participated in PT earlier. Will return as schedule allows.   Charis M Capehart  Charis Capehart MSOT, OTR/L Acute Rehab Pager: [phone] Office: [phone] 10/08/2020, 1:08 PM

## 2020-10-08 NOTE — Plan of Care (Signed)
Nutrition Education Note:  RD consulted for nutrition education regarding diabetes.   Lab Results  Component Value Date   HGBA1C 10.3 (H) 10/07/2020   RD provided "Carbohydrate Counting for People with Diabetes" handout from the Academy of Nutrition and Dietetics. Discussed different food groups and their effects on blood sugar, emphasizing carbohydrate-containing foods. Provided list of carbohydrates and recommended serving sizes of common foods.   Pt asleep, so spoke with her daughter and grandson. Daughter reports starting to make healthy changes at home, having salads with meals for the last month. Daughter appreciative of outpatient diabetes education also.  Discussed importance of controlled and consistent carbohydrate intake throughout the day. Provided examples of ways to balance meals/snacks and encouraged intake of high-fiber, whole grain complex carbohydrates. Teach back method used.  Expect good compliance.  Body mass index is 50.73 kg/m. Pt meets criteria for obesity class 3 based on current BMI.  Current diet order is carb modified. Labs and medications reviewed. Spoke with family regarding appetite changes and weight changes. Family reports pt has had no changes in appetite and steady weight gain at the onset of the COVID-19 pandemic.  No further nutrition interventions warranted at this time. RD contact information provided. If additional nutrition issues arise, please re-consult RD.  Vertell Limber, RD, LDN Registered Dietitian After Hours/Weekend Pager # in [place]

## 2020-10-08 NOTE — Plan of Care (Signed)
  Problem: Education: Goal: Knowledge of General Education information will improve Description: Including pain rating scale, medication(s)/side effects and non-pharmacologic comfort measures Outcome: Progressing   Problem: Clinical Measurements: Goal: Ability to maintain clinical measurements within normal limits will improve Outcome: Progressing Goal: Will remain free from infection Outcome: Progressing Goal: Diagnostic test results will improve Outcome: Progressing Goal: Respiratory complications will improve Outcome: Progressing Goal: Cardiovascular complication will be avoided Outcome: Progressing   Problem: Health Behavior/Discharge Planning: Goal: Ability to manage health-related needs will improve Outcome: Progressing   Problem: Activity: Goal: Risk for activity intolerance will decrease Outcome: Progressing   Problem: Nutrition: Goal: Adequate nutrition will be maintained Outcome: Progressing   Problem: Elimination: Goal: Will not experience complications related to bowel motility Outcome: Progressing Goal: Will not experience complications related to urinary retention Outcome: Progressing   Problem: Coping: Goal: Level of anxiety will decrease Outcome: Progressing   Problem: Safety: Goal: Ability to remain free from injury will improve Outcome: Progressing   Problem: Pain Managment: Goal: General experience of comfort will improve Outcome: Progressing

## 2020-10-08 NOTE — Progress Notes (Signed)
Physical Therapy Treatment Patient Details Name: Julia Henderson MRN: [id number] DOB: 09/03/1960 Today's Date: 10/08/2020    History of Present Illness 60 yo female with onset of acute respiratory failure on chronic home O2 was brought to ED.  Pt is SOB even for baseline, new polyuria, coughing and has HA with productive cough.  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  PMHx:  bronchitis, HTN, COPD, Covid,    PT Comments    The pt was able to make good progress with OOB mobility and transfers at this time, despite significant discomfort from frequent coughing fits this afternoon. Session completed on 4L O2. The pt was able to complete multiple standing exercises focused on LE strengthening and pre-gait stability to allow for improved technique, stability, and strength for future gait training. The pt was limited by fatigue and weakness in BLE, but was able to recover with 2-3 min seated rest between activities. The pt remains highly motivated, and will benefit from continued skilled PT to progress functional endurance, strength, power, and independence with mobility.     Follow Up Recommendations  Home health PT;Supervision for mobility/OOB (would benefit from progression to OP aquatic PT)     Equipment Recommendations  Rolling walker with 5" wheels (rollator)    Recommendations for Other Services       Precautions / Restrictions Precautions Precautions: Fall Precaution Comments: monitor O2 sats Restrictions Weight Bearing Restrictions: No    Mobility  Bed Mobility Overal bed mobility: Modified Independent             General bed mobility comments: pt sitting EOB upon arrival of PT    Transfers Overall transfer level: Needs assistance Equipment used: 4-wheeled walker Transfers: Sit to/from Stand Sit to Stand: Supervision         General transfer comment: supervision for safety, pt with good use of hands on bed, reaching securely  for rollator. educated on use of breaks/features of rollator. completed x2, x3, then 2 x 6  Ambulation/Gait             General Gait Details: pt declined today due to increased coughing and concerns for endurance with coughing fits. agreeable to EOB/standing exercises instead       Balance Overall balance assessment: Needs assistance Sitting-balance support: Feet supported Sitting balance-Leahy Scale: Good Sitting balance - Comments: able to sit EOB without assist or UE support, able to reach minimlaly outside BOS, but requries assist to reach outside BOS   Standing balance support: No upper extremity supported;Bilateral upper extremity supported Standing balance-Leahy Scale: Fair Standing balance comment: static stance without UE support, prefers UE support for balance/fatigue                            Cognition Arousal/Alertness: Awake/alert Behavior During Therapy: WFL for tasks assessed/performed Overall Cognitive Status: Within Functional Limits for tasks assessed                                 General Comments: Pt able to demo good safety awareness, planning for mobilitry, energy conservation      Exercises Other Exercises Other Exercises: sit-stand x2; x3; 2 x 6 with seated rest break between 2-3 min to recover. HR to 120, SpO2 88 and above. Other Exercises: standing marches 2 x 3 with BUE supported on rollator. HR to 120s, pt with significant shakiness in  BLE with exertion    General Comments General comments (skin integrity, edema, etc.): VSS on 4L O2. Pt with increased coughing this session requiring seated breaks. educated on guided breathing with mobility      Pertinent Vitals/Pain Pain Assessment: No/denies pain           PT Goals (current goals can now be found in the care plan section) Acute Rehab PT Goals Patient Stated Goal: to walk and get stronger again PT Goal Formulation: With patient/family Time For Goal  Achievement: 10/21/20 Potential to Achieve Goals: Good Progress towards PT goals: Progressing toward goals    Frequency    Min 3X/week      PT Plan Current plan remains appropriate       AM-PAC PT "6 Clicks" Mobility   Outcome Measure  Help needed turning from your back to your side while in a flat bed without using bedrails?: A Little Help needed moving from lying on your back to sitting on the side of a flat bed without using bedrails?: A Little Help needed moving to and from a bed to a chair (including a wheelchair)?: A Little Help needed standing up from a chair using your arms (e.g., wheelchair or bedside chair)?: A Little Help needed to walk in hospital room?: A Little Help needed climbing 3-5 steps with a railing? : A Lot 6 Click Score: 17    End of Session Equipment Utilized During Treatment: Gait belt;Oxygen Activity Tolerance: Patient limited by fatigue;Patient tolerated treatment well Patient left: in bed;with call bell/phone within reach Nurse Communication: Mobility status PT Visit Diagnosis: Unsteadiness on feet (R26.81);Dizziness and giddiness (R42);Muscle weakness (generalized) (M62.81)     Time: 3762-8315 PT Time Calculation (min) (ACUTE ONLY): 35 min  Charges:  $Therapeutic Exercise: 23-37 mins                     Julia Henderson, PT, DPT   Acute Rehabilitation Department Pager #: [phone]   Julia Henderson 10/08/2020, 1:32 PM

## 2020-10-08 NOTE — Progress Notes (Signed)
Inpatient Diabetes Program Recommendations  AACE/ADA: New Consensus Statement on Inpatient Glycemic Control (2015)  Target Ranges:  Prepandial:   less than 140 mg/dL      Peak postprandial:   less than 180 mg/dL (1-2 hours)      Critically ill patients:  140 - 180 mg/dL    Results for [name], Julia Henderson (MRN [id number]) as of 10/08/2020 11:10  Ref. Range 10/07/2020 07:41 10/07/2020 14:39 10/07/2020 17:29 10/07/2020 20:29 10/08/2020 07:50  Glucose-Capillary Latest Ref Range: 70 - 99 mg/dL 767 (H) 341 (H) 937 (H) 439 (H) 364 (H)    Admit with: Acute respiratory failure with hypoxia/ DM2 - new diagnosis COPD exacerbation vs PE vs post COVID pulmonary fibrosis  History: COPD  Current Orders: Novolog Moderate Correction Scale/ SSI (0-15 units) TID AC + HS  A1c 10.3% this admission Prednisone 40 mg Daily  -   Based on weight and glucose 420's on presentation consider Lantus 25 units (0.2 units/kg)  New Diagnosis of Diabetes--No PCP listed--No Insurance listed--Plan to consult TOC team  Currently pt aware of her glucose levels and willing to take insulin in hospital and open to info about hyperglycemia, however, currently is not willing to accept a diagnosis of diabetes at this time.   --Will follow patient during hospitalization--  Christena Deem RN, MSN, BC-ADM Inpatient Diabetes Coordinator Team Pager [phone] (8a-5p)

## 2020-10-08 NOTE — Progress Notes (Signed)
PROGRESS NOTE    Julia Henderson  MBW:[id number] DOB: 06-12-1961 DOA: 10/06/2020 PCP: Patient, No Pcp Per    Chief Complaint  Patient presents with  . Shortness of Breath    Brief Narrative:  Patient 60 year old morbidly obese female history of COPD/chronic bronchitis, hypertension, hyperlipidemia, recent hospitalization 08/24/2020 at [facility] for COVID-19 infection noted at time with shortness of breath and discharged home on 4 L home O2 as needed. Patient presented to the ED with a 4-day history of productive cough of greenish sputum, body aches, chills, headache, worsening shortness of breath which has progressed. On presentation to the ED patient noted with severe wheezing with some improvement with nebulizer treatments and steroids.  Blood glucose level noted to be 420.  D-dimer elevated at 0.57. CT angiogram chest ordered however patient refused until daughter could be here due to anxiety. Patient admitted for acute COPD exacerbation versus post Covid pulmonary fibrosis versus PE.Marland Kitchen   Assessment & Plan:   Principal Problem:   Acute respiratory failure with hypoxia (HCC) Active Problems:   HTN (hypertension)   Morbid obesity (HCC)   DM2 (diabetes mellitus, type 2) (HCC)  1 acute respiratory failure with hypoxia/acute COPD exacerbation with bronchitis -Likely secondary to acute COPD exacerbation with bronchitis.   -D-dimer elevated,  -CT angiogram chest negative for PE or dissection, no infiltrate noted. -Patient slowly improving clinically. -Patient with cough of productive sputum with associated wheezing with coughing. -Continue Pulmicort nebs twice daily, scheduled duo nebs, Claritin, Flonase, PPI. -Decrease IV Solu-Medrol to 60 mg every 12 hours. -Continue IV Rocephin to complete a 5-day course. -DC Dulera. -Change full dose Lovenox to prophylactic dose Lovenox. -Supportive care. -Would likely benefit from outpatient referral to pulmonary via PCP post  discharge.  2.  Hypertension Continue Norvasc 5 mg daily.  DC spironolactone.   3.  Newly diagnosed diabetes mellitus type 2 -Patient states does not have a history of diabetes. -Hemoglobin A1c 10.3 (10/07/2020). -CBG elevated at 364 this morning.   -Increase Lantus to 25 units daily.   -Continue resistant SSI.   -Increase meal coverage NovoLog 8 units 3 times daily.   -Diabetic coordinator consulted and is following.  4.  UTI -Urinalysis with moderate leukocytes, nitrite negative, WBC > 50 -Urine cultures with multiple species.   -On IV Rocephin.  5.  Hyperkalemia Potassium of 5.2.   -Discontinue spironolactone.   -Repeat labs in the morning.  6.  Morbid obesity    DVT prophylaxis: Lovenox Code Status: Full Family Communication: Updated patient.  No family at bedside. Disposition:   Status is: Inpatient    Dispo: The patient is from: Home              Anticipated d/c is to: Home              Patient currently on IV antibiotics, IV steroids, nebulizer treatments.not stable for discharge.    Difficult to place patient now       Consultants:   None  Procedures:  CT angiogram chest 10/07/2020  Chest x-ray 10/06/2020    Antimicrobials:   IV Rocephin 10/07/2020>>>>   Subjective: Patient just coming out of the bathroom.  Patient coughing.  Patient stated was doing well up until a few minutes ago when she started to cough with some noted diffuse expiratory wheezing.   No chest pain.  Tolerating current diet.    Objective: Vitals:   10/07/20 2018 10/08/20 0026 10/08/20 0350 10/08/20 1454  BP:    109/66  Pulse:  99 96 (!) 101 97  Resp: 18 17  16   Temp:   98.5 F (36.9 C) 98 F (36.7 C)  TempSrc:   Oral Axillary  SpO2: 95% 95%  93%  Weight:        Intake/Output Summary (Last 24 hours) at 10/08/2020 1603 Last data filed at 10/07/2020 2000 Gross per 24 hour  Intake 240 ml  Output -  Net 240 ml   Filed Weights   10/07/20 0832  Weight: 121.8 kg     Examination:  General exam: Appears calm and comfortable  Respiratory system: Decreased breath sounds in the bases.  Some diffuse expiratory wheezing is improving.  No rhonchi.  No crackles.  Cardiovascular system: RRR no murmurs rubs or gallops.  Distant heart sounds secondary to body habitus.  No pitting lower extremity edema. Gastrointestinal system: Abdomen is obese, soft, nontender, nondistended, positive bowel sounds.  No rebound.  No guarding.  Central nervous system: Alert and oriented.  Moving extremities spontaneously.  No focal neurological deficits.   Extremities: Symmetric 5 x 5 power. Skin: No rashes, lesions or ulcers Psychiatry: Judgement and insight appear normal. Mood & affect appropriate.     Data Reviewed: I have personally reviewed following labs and imaging studies  CBC: Recent Labs  Lab 10/06/20 1852 10/07/20 0207 10/07/20 0425 10/08/20 0200  WBC 7.4  --  11.8* 12.5*  NEUTROABS  --   --   --  10.3*  HGB 12.7 14.3 13.4 13.0  HCT 42.6 42.0 44.3 43.3  MCV 96.6  --  96.7 96.0  PLT 223  --  247 264    Basic Metabolic Panel: Recent Labs  Lab 10/06/20 1852 10/07/20 0207 10/07/20 0425 10/07/20 2052 10/08/20 0200  NA 137 138 135  --  134*  K 4.5 3.8 4.5  --  5.2*  CL 98  --  97*  --  94*  CO2 32  --  26  --  30  GLUCOSE 420*  --  398* 433* 324*  BUN 13  --  16  --  33*  CREATININE 0.82  --  1.22*  --  1.28*  CALCIUM 9.4  --  9.1  --  9.7  MG  --   --   --   --  1.9    GFR: Estimated Creatinine Clearance: 56.4 mL/min (A) (by C-G formula based on SCr of 1.28 mg/dL (H)).  Liver Function Tests: Recent Labs  Lab 10/07/20 0023  AST 62*  ALT 43  ALKPHOS 58  BILITOT 0.6  PROT 7.7  ALBUMIN 3.7    CBG: Recent Labs  Lab 10/07/20 1439 10/07/20 1729 10/07/20 2029 10/08/20 0750 10/08/20 1229  GLUCAP 464* 485* 439* 364* 334*     Recent Results (from the past 240 hour(s))  Respiratory (~20 pathogens) panel by PCR     Status: Abnormal    Collection Time: 10/07/20  3:14 AM   Specimen: Nasopharyngeal Swab; Respiratory  Result Value Ref Range Status   Adenovirus NOT DETECTED NOT DETECTED Final   Coronavirus 229E NOT DETECTED NOT DETECTED Final    Comment: (NOTE) The Coronavirus on the Respiratory Panel, DOES NOT test for the novel  Coronavirus (2019 nCoV)    Coronavirus HKU1 NOT DETECTED NOT DETECTED Final   Coronavirus NL63 NOT DETECTED NOT DETECTED Final   Coronavirus OC43 NOT DETECTED NOT DETECTED Final   Metapneumovirus NOT DETECTED NOT DETECTED Final   Rhinovirus / Enterovirus DETECTED (A) NOT DETECTED Final   Influenza A NOT  DETECTED NOT DETECTED Final   Influenza B NOT DETECTED NOT DETECTED Final   Parainfluenza Virus 1 NOT DETECTED NOT DETECTED Final   Parainfluenza Virus 2 NOT DETECTED NOT DETECTED Final   Parainfluenza Virus 3 NOT DETECTED NOT DETECTED Final   Parainfluenza Virus 4 NOT DETECTED NOT DETECTED Final   Respiratory Syncytial Virus NOT DETECTED NOT DETECTED Final   Bordetella pertussis NOT DETECTED NOT DETECTED Final   Bordetella Parapertussis NOT DETECTED NOT DETECTED Final   Chlamydophila pneumoniae NOT DETECTED NOT DETECTED Final   Mycoplasma pneumoniae NOT DETECTED NOT DETECTED Final    Comment: Performed at [facility], [address] [place], [place] [address]  Culture, Urine     Status: Abnormal   Collection Time: 10/07/20  5:40 PM   Specimen: Urine, Random  Result Value Ref Range Status   Specimen Description URINE, RANDOM  Final   Special Requests   Final    NONE Performed at [facility], [address] [place], [place] [address]    Culture MULTIPLE SPECIES PRESENT, SUGGEST RECOLLECTION (A)  Final   Report Status 10/08/2020 FINAL  Final         Radiology Studies: DG Chest 2 View  Result Date: 10/06/2020 CLINICAL DATA:  60 year old female with shortness of breath. EXAM: CHEST - 2 VIEW COMPARISON:  Chest radiograph dated 09/13/2020. FINDINGS: No focal  consolidation, pleural effusion or pneumothorax. Top-normal cardiac size. No acute osseous pathology. IMPRESSION: No active cardiopulmonary disease. Electronically Signed   By: Elgie Collard M.D.   On: 10/06/2020 19:35   CT ANGIO CHEST PE W OR WO CONTRAST  Result Date: 10/07/2020 CLINICAL DATA:  Shortness of breath EXAM: CT ANGIOGRAPHY CHEST WITH CONTRAST TECHNIQUE: Multidetector CT imaging of the chest was performed using the standard protocol during bolus administration of intravenous contrast. Multiplanar CT image reconstructions and MIPs were obtained to evaluate the vascular anatomy. CONTRAST:  56mL OMNIPAQUE IOHEXOL 350 MG/ML SOLN COMPARISON:  Chest radiograph October 06, 2020 FINDINGS: Cardiovascular: There is no demonstrable pulmonary embolus. There is no thoracic aortic aneurysm or dissection. There are occasional foci of calcification in visualized great vessels. There are occasional foci of coronary artery calcification. There is no pericardial effusion or pericardial thickening. Mediastinum/Nodes: Visualized thyroid appears unremarkable. No evident thoracic adenopathy. No esophageal lesions are appreciable. Lungs/Pleura: There are scattered foci of atelectatic change. No edema or airspace opacity present. No pleural effusions. No pneumothorax. Trachea and major bronchial structures appear unremarkable. Upper Abdomen: Visualized upper abdominal structures appear unremarkable. Musculoskeletal: No blastic or lytic bone lesions. No evident chest wall lesion. Review of the MIP images confirms the above findings. IMPRESSION: 1. No demonstrable pulmonary embolus. No thoracic aortic aneurysm or dissection. There are scattered foci of coronary artery calcification. 2. Scattered areas of atelectasis. No edema or airspace opacity. No evident pleural effusions. 3.  No evident adenopathy. Electronically Signed   By: Bretta Bang III M.D.   On: 10/07/2020 13:33        Scheduled Meds: . amLODipine  5  mg Oral BID  . vitamin C  500 mg Oral Daily  . aspirin EC  81 mg Oral Daily  . budesonide (PULMICORT) nebulizer solution  0.5 mg Nebulization BID  . cholecalciferol  5,000 Units Oral Daily  . enoxaparin (LOVENOX) injection  60 mg Subcutaneous Q24H  . fluticasone  2 spray Each Nare Daily  . gabapentin  900 mg Oral TID  . guaiFENesin  1,200 mg Oral BID  . insulin aspart  0-20 Units Subcutaneous TID WC  . insulin aspart  0-5 Units Subcutaneous QHS  . insulin aspart  8 Units Subcutaneous TID WC  . [START ON 10/09/2020] insulin glargine  25 Units Subcutaneous Daily  . ipratropium-albuterol  3 mL Nebulization Q6H  . living well with diabetes book   Does not apply Once  . loratadine  10 mg Oral Daily  . methylPREDNISolone (SOLU-MEDROL) injection  60 mg Intravenous Q12H  . metoprolol tartrate  100 mg Oral BID  . montelukast  10 mg Oral QHS  . multivitamin with minerals  1 tablet Oral Daily  . omega-3 acid ethyl esters  1,000 mg Oral Daily  . pantoprazole  40 mg Oral Q0600  . [START ON 10/09/2020] pneumococcal 23 valent vaccine  0.5 mL Intramuscular Tomorrow-1000  . pravastatin  40 mg Oral QHS  . traZODone  100 mg Oral QHS  . umeclidinium bromide  1 puff Inhalation Daily   Continuous Infusions: . cefTRIAXone (ROCEPHIN)  IV 1 g (10/08/20 0517)     LOS: 1 day    Time spent: 40 minutes    Ramiro Harvest, MD [facility]   To contact the attending provider between 7A-7P or the covering provider during after hours 7P-7A, please log into the web site [email or web address] and access using universal [place] password for that web site. If you do not have the password, please call the hospital operator.  10/08/2020, 4:03 PM

## 2020-10-08 NOTE — Progress Notes (Signed)
   10/08/20 1230  Clinical Encounter Type  Visited With Other (Comment) (Called Father Ree Kida [phone])  Visit Type Spiritual support  Referral From Nurse  Consult/Referral To Chaplain  Chaplain responded to consult. Patient is requesting a [employer]. I called Father Ree Kida [phone] and left voicemail message stating patient is requesting a visit. This note was prepared by Deneen Harts, M.Div..  For questions please contact by phone [phone].

## 2020-10-09 LAB — CBC WITH DIFFERENTIAL/PLATELET
Abs Immature Granulocytes: 0.22 10*3/uL — ABNORMAL HIGH (ref 0.00–0.07)
Basophils Absolute: 0 10*3/uL (ref 0.0–0.1)
Basophils Relative: 0 %
Eosinophils Absolute: 0 10*3/uL (ref 0.0–0.5)
Eosinophils Relative: 0 %
HCT: 41.5 % (ref 36.0–46.0)
Hemoglobin: 12.6 g/dL (ref 12.0–15.0)
Immature Granulocytes: 1 %
Lymphocytes Relative: 7 %
Lymphs Abs: 1.2 10*3/uL (ref 0.7–4.0)
MCH: 29.4 pg (ref 26.0–34.0)
MCHC: 30.4 g/dL (ref 30.0–36.0)
MCV: 96.7 fL (ref 80.0–100.0)
Monocytes Absolute: 1.3 10*3/uL — ABNORMAL HIGH (ref 0.1–1.0)
Monocytes Relative: 8 %
Neutro Abs: 14 10*3/uL — ABNORMAL HIGH (ref 1.7–7.7)
Neutrophils Relative %: 84 %
Platelets: 258 10*3/uL (ref 150–400)
RBC: 4.29 MIL/uL (ref 3.87–5.11)
RDW: 18.3 % — ABNORMAL HIGH (ref 11.5–15.5)
WBC: 16.8 10*3/uL — ABNORMAL HIGH (ref 4.0–10.5)
nRBC: 0.2 % (ref 0.0–0.2)

## 2020-10-09 LAB — GLUCOSE, CAPILLARY
Glucose-Capillary: 331 mg/dL — ABNORMAL HIGH (ref 70–99)
Glucose-Capillary: 407 mg/dL — ABNORMAL HIGH (ref 70–99)
Glucose-Capillary: 413 mg/dL — ABNORMAL HIGH (ref 70–99)
Glucose-Capillary: 354 mg/dL — ABNORMAL HIGH (ref 70–99)

## 2020-10-09 LAB — BASIC METABOLIC PANEL
Anion gap: 8 (ref 5–15)
BUN: 47 mg/dL — ABNORMAL HIGH (ref 8–23)
CO2: 27 mmol/L (ref 22–32)
Calcium: 9.2 mg/dL (ref 8.9–10.3)
Chloride: 97 mmol/L — ABNORMAL LOW (ref 98–111)
Creatinine, Ser: 1.18 mg/dL — ABNORMAL HIGH (ref 0.44–1.00)
GFR, Estimated: 53 mL/min — ABNORMAL LOW (ref >60)
Glucose, Bld: 259 mg/dL — ABNORMAL HIGH (ref 70–99)
Potassium: 5.4 mmol/L — ABNORMAL HIGH (ref 3.5–5.1)
Sodium: 132 mmol/L — ABNORMAL LOW (ref 135–145)

## 2020-10-09 LAB — MAGNESIUM: Magnesium: 2 mg/dL (ref 1.7–2.4)

## 2020-10-09 MED ORDER — METFORMIN HCL 500 MG PO TABS
1000.0000 mg | ORAL_TABLET | Freq: Two times a day (BID) | ORAL | Status: DC
Start: 2020-10-09 — End: 2020-10-11
  Administered 2020-10-09 – 2020-10-10 (×3): 1000 mg via ORAL
  Filled 2020-10-09 (×3): qty 2

## 2020-10-09 MED ORDER — METHYLPREDNISOLONE SODIUM SUCC 40 MG IJ SOLR
40.0000 mg | Freq: Every day | INTRAMUSCULAR | Status: DC
  Administered 2020-10-10: 40 mg via INTRAVENOUS
  Filled 2020-10-09: qty 1

## 2020-10-09 MED ORDER — SODIUM ZIRCONIUM CYCLOSILICATE 10 G PO PACK
10.0000 g | PACK | Freq: Once | ORAL | Status: AC
  Administered 2020-10-09: 10 g via ORAL
  Filled 2020-10-09: qty 1

## 2020-10-09 MED ORDER — POLYETHYLENE GLYCOL 3350 17 G PO PACK
17.0000 g | PACK | Freq: Two times a day (BID) | ORAL | Status: AC
  Administered 2020-10-09: 17 g via ORAL
  Filled 2020-10-09 (×2): qty 1

## 2020-10-09 MED ORDER — IPRATROPIUM-ALBUTEROL 0.5-2.5 (3) MG/3ML IN SOLN
3.0000 mL | Freq: Two times a day (BID) | RESPIRATORY_TRACT | Status: DC
  Administered 2020-10-09 – 2020-10-11 (×4): 3 mL via RESPIRATORY_TRACT
  Filled 2020-10-09 (×4): qty 3

## 2020-10-09 NOTE — Progress Notes (Addendum)
Inpatient Diabetes Program Recommendations  AACE/ADA: New Consensus Statement on Inpatient Glycemic Control (2015)  Target Ranges:  Prepandial:   less than 140 mg/dL      Peak postprandial:   less than 180 mg/dL (1-2 hours)      Critically ill patients:  140 - 180 mg/dL    Results for Julia Henderson, Julia Henderson (MRN [id number]) as of 10/09/2020 10:34  Ref. Range 10/08/2020 07:50 10/08/2020 12:29 10/08/2020 16:55 10/08/2020 20:30 10/09/2020 07:41  Glucose-Capillary Latest Ref Range: 70 - 99 mg/dL 542 (H) 706 (H) 237 (H) 377 (H) 331 (H)   Admit with: Acute respiratory failure with hypoxia/ DM2 - new diagnosis COPD exacerbation vs PE vs post COVID pulmonary fibrosis  History: COPD  Current Orders:  Lantus 25 units Daily Novolog 0-20 units tid + hs Novolog 8 units tid meal coverage  A1c 10.3% this admission Solumedrol 60 mg Q12 hours  -   Based on weight and glucose 300's increase Lantus 35 units, add additional 10 units today.  New Diagnosis of Diabetes--No PCP listed--No Insurance listed--Plan to consult TOC team  Currently pt aware of her glucose levels and willing to take insulin in hospital and open to info about hyperglycemia, however, currently is not willing to accept a diagnosis of diabetes at this time.   --Will follow patient during hospitalization--  Christena Deem RN, MSN, BC-ADM Inpatient Diabetes Coordinator Team Pager [phone] (8a-5p)

## 2020-10-09 NOTE — Care Management (Addendum)
Case manager acknowledged MD consult requesting medication assistance for patient. CM has entered  [facility] for patient. TOC Team will continue to monitor.  Charlyn Minerva BSN Case Manager

## 2020-10-09 NOTE — Progress Notes (Signed)
PROGRESS NOTE    Lovie MacadamiaCynthia Berninger  ZOX:[id number] DOB: 02/01/1961 DOA: 10/06/2020 PCP: Patient, No Pcp Per    Chief Complaint  Patient presents with  . Shortness of Breath    Brief Narrative:   Patient 60 year old morbidly obese female history of COPD/chronic bronchitis, hypertension, hyperlipidemia, recent hospitalization [facility]24/[facility] at [facility] for COVID-19 infection noted at time with shortness of breath and discharged home on 4 L home O2 as needed. Patient presented to the ED with a 4-day history of productive cough of greenish sputum, body aches, chills, headache, worsening shortness of breath which has progressed. On presentation to the ED patient noted with severe wheezing with some improvement with nebulizer treatments and steroids.  Blood glucose level noted to be 420.  D-dimer elevated at 0.57. CT angiogram chest ordered however patient refused until daughter could be here due to anxiety. Patient admitted for acute COPD exacerbation versus post Covid pulmonary fibrosis versus PE.Marland Kitchen.   Assessment & Plan:   Principal Problem:   Acute respiratory failure with hypoxia (HCC) Active Problems:   HTN (hypertension)   Morbid obesity (HCC)   DM2 (diabetes mellitus, type 2) (HCC)  1 acute respiratory failure with hypoxia/acute COPD exacerbation with bronchitis -Likely secondary to acute COPD exacerbation with bronchitis.   -D-dimers was elevated, so CTA chest was obtained, it is negative for PE or dissection, no infiltrate . -Patient with cough of productive sputum with associated wheezing with coughing. -Continue Pulmicort nebs twice daily, scheduled duo nebs, Claritin, Flonase, PPI. -Decrease IV Solu-Medrol to 60 mg every 12 hours. -Continue IV Rocephin to complete a 5-day course. -DC Dulera. -Change full dose Lovenox to prophylactic dose Lovenox. -Supportive care. -I have discussed with the patient, likely she is having undiagnosed OSA, so recommended to follow-up as an  outpatient regarding sleep study. -Denies history of smoking, but reports secondhand smoking . -We will decrease IV steroids, will try to taper oxygen, have decreased from 4 to 2 L nasal cannula this morning. -I have encouraged her to get out of bed to chair, and use incentive spirometer today.  2.  Hypertension Continue Norvasc 5 mg daily.  DC spironolactone.   3.  Newly diagnosed diabetes mellitus type 2 -Patient states does not have a history of diabetes. -Hemoglobin A1c 10.3 (10/07/2020). -CBG remains elevated, but I will hold increase her Lantus more than 25 units as I will start her on Metformin, will taper her steroids today.. -Continue resistant SSI.   -continue with meal coverage NovoLog 8 units 3 times daily.   -Diabetic coordinator consulted and is following.  4.  UTI -Urinalysis with moderate leukocytes, nitrite negative, WBC > 50 -Urine cultures with multiple species.   -On IV Rocephin.  5.  Hyperkalemia -Potassium is 5.4 today, Aldactone has been stopped on admission, will give Lokelma.  6.  Morbid obesity    DVT prophylaxis: Lovenox Code Status: Full Family Communication: Updated patient.  No family at bedside. Disposition:   Status is: Inpatient    Dispo: The patient is from: Home              Anticipated d/c is to: Home              Patient currently on IV antibiotics, IV steroids, nebulizer treatments.not stable for discharge.    Difficult to place patient now       Consultants:   None  Procedures:  CT angiogram chest 10/07/2020  Chest x-ray 10/06/2020    Antimicrobials:   IV Rocephin 10/07/2020>>>>  Subjective:  She reports insomnia overnight, reports dyspnea with activity, but overall has improved,   Objective: Vitals:   10/08/20 2105 10/09/20 0222 10/09/20 0334 10/09/20 1145  BP:   119/61 138/76  Pulse:   87 78  Resp:   20 20  Temp:   98.1 F (36.7 C) 98 F (36.7 C)  TempSrc:   Axillary Oral  SpO2: 96% 96% 92% 92%   Weight:        Intake/Output Summary (Last 24 hours) at 10/09/2020 1148 Last data filed at 10/08/2020 1942 Gross per 24 hour  Intake 360 ml  Output -  Net 360 ml   Filed Weights   10/07/20 0832  Weight: 121.8 kg    Examination:   Awake Alert, Oriented X 3, No new F.N deficits, Normal affect Symmetrical Chest wall movement, diminished air entry at the bases with scattered wheezing. RRR,No Gallops,Rubs or new Murmurs, No Parasternal Heave +ve B.Sounds, Abd Soft, No tenderness, No rebound - guarding or rigidity. No Cyanosis, Clubbing or edema, No new Rash or bruise      Data Reviewed: I have personally reviewed following labs and imaging studies  CBC: Recent Labs  Lab 10/06/20 1852 10/07/20 0207 10/07/20 0425 10/08/20 0200 10/09/20 0121  WBC 7.4  --  11.8* 12.5* 16.8*  NEUTROABS  --   --   --  10.3* 14.0*  HGB 12.7 14.3 13.4 13.0 12.6  HCT 42.6 42.0 44.3 43.3 41.5  MCV 96.6  --  96.7 96.0 96.7  PLT 223  --  247 264 258    Basic Metabolic Panel: Recent Labs  Lab 10/06/20 1852 10/07/20 0207 10/07/20 0425 10/07/20 2052 10/08/20 0200 10/09/20 0121  NA 137 138 135  --  134* 132*  K 4.5 3.8 4.5  --  5.2* 5.4*  CL 98  --  97*  --  94* 97*  CO2 32  --  26  --  30 27  GLUCOSE 420*  --  398* 433* 324* 259*  BUN 13  --  16  --  33* 47*  CREATININE 0.82  --  1.22*  --  1.28* 1.18*  CALCIUM 9.4  --  9.1  --  9.7 9.2  MG  --   --   --   --  1.9 2.0    GFR: Estimated Creatinine Clearance: 61.2 mL/min (A) (by C-G formula based on SCr of 1.18 mg/dL (H)).  Liver Function Tests: Recent Labs  Lab 10/07/20 0023  AST 62*  ALT 43  ALKPHOS 58  BILITOT 0.6  PROT 7.7  ALBUMIN 3.7    CBG: Recent Labs  Lab 10/08/20 0750 10/08/20 1229 10/08/20 1655 10/08/20 2030 10/09/20 0741  GLUCAP 364* 334* 178* 377* 331*     Recent Results (from the past 240 hour(s))  Respiratory (~20 pathogens) panel by PCR     Status: Abnormal   Collection Time: 10/07/20  3:14 AM    Specimen: Nasopharyngeal Swab; Respiratory  Result Value Ref Range Status   Adenovirus NOT DETECTED NOT DETECTED Final   Coronavirus 229E NOT DETECTED NOT DETECTED Final    Comment: (NOTE) The Coronavirus on the Respiratory Panel, DOES NOT test for the novel  Coronavirus (2019 nCoV)    Coronavirus HKU1 NOT DETECTED NOT DETECTED Final   Coronavirus NL63 NOT DETECTED NOT DETECTED Final   Coronavirus OC43 NOT DETECTED NOT DETECTED Final   Metapneumovirus NOT DETECTED NOT DETECTED Final   Rhinovirus / Enterovirus DETECTED (A) NOT DETECTED Final   Influenza  A NOT DETECTED NOT DETECTED Final   Influenza B NOT DETECTED NOT DETECTED Final   Parainfluenza Virus 1 NOT DETECTED NOT DETECTED Final   Parainfluenza Virus 2 NOT DETECTED NOT DETECTED Final   Parainfluenza Virus 3 NOT DETECTED NOT DETECTED Final   Parainfluenza Virus 4 NOT DETECTED NOT DETECTED Final   Respiratory Syncytial Virus NOT DETECTED NOT DETECTED Final   Bordetella pertussis NOT DETECTED NOT DETECTED Final   Bordetella Parapertussis NOT DETECTED NOT DETECTED Final   Chlamydophila pneumoniae NOT DETECTED NOT DETECTED Final   Mycoplasma pneumoniae NOT DETECTED NOT DETECTED Final    Comment: Performed at [facility], [address] [place], [place] [address]  Culture, Urine     Status: Abnormal   Collection Time: 10/07/20  5:40 PM   Specimen: Urine, Random  Result Value Ref Range Status   Specimen Description URINE, RANDOM  Final   Special Requests   Final    NONE Performed at [facility], [address], [place], [place] [address]    Culture MULTIPLE SPECIES PRESENT, SUGGEST RECOLLECTION (A)  Final   Report Status 10/08/2020 FINAL  Final         Radiology Studies: CT ANGIO CHEST PE W OR WO CONTRAST  Result Date: 10/07/2020 CLINICAL DATA:  Shortness of breath EXAM: CT ANGIOGRAPHY CHEST WITH CONTRAST TECHNIQUE: Multidetector CT imaging of the chest was performed using the standard protocol during bolus  administration of intravenous contrast. Multiplanar CT image reconstructions and MIPs were obtained to evaluate the vascular anatomy. CONTRAST:  66mL OMNIPAQUE IOHEXOL 350 MG/ML SOLN COMPARISON:  Chest radiograph October 06, 2020 FINDINGS: Cardiovascular: There is no demonstrable pulmonary embolus. There is no thoracic aortic aneurysm or dissection. There are occasional foci of calcification in visualized great vessels. There are occasional foci of coronary artery calcification. There is no pericardial effusion or pericardial thickening. Mediastinum/Nodes: Visualized thyroid appears unremarkable. No evident thoracic adenopathy. No esophageal lesions are appreciable. Lungs/Pleura: There are scattered foci of atelectatic change. No edema or airspace opacity present. No pleural effusions. No pneumothorax. Trachea and major bronchial structures appear unremarkable. Upper Abdomen: Visualized upper abdominal structures appear unremarkable. Musculoskeletal: No blastic or lytic bone lesions. No evident chest wall lesion. Review of the MIP images confirms the above findings. IMPRESSION: 1. No demonstrable pulmonary embolus. No thoracic aortic aneurysm or dissection. There are scattered foci of coronary artery calcification. 2. Scattered areas of atelectasis. No edema or airspace opacity. No evident pleural effusions. 3.  No evident adenopathy. Electronically Signed   By: Bretta Bang III M.D.   On: 10/07/2020 13:33        Scheduled Meds: . amLODipine  5 mg Oral BID  . vitamin C  500 mg Oral Daily  . aspirin EC  81 mg Oral Daily  . budesonide (PULMICORT) nebulizer solution  0.5 mg Nebulization BID  . cholecalciferol  5,000 Units Oral Daily  . enoxaparin (LOVENOX) injection  60 mg Subcutaneous Q24H  . fluticasone  2 spray Each Nare Daily  . gabapentin  900 mg Oral TID  . guaiFENesin  1,200 mg Oral BID  . insulin aspart  0-20 Units Subcutaneous TID WC  . insulin aspart  0-5 Units Subcutaneous QHS  .  insulin aspart  8 Units Subcutaneous TID WC  . insulin glargine  25 Units Subcutaneous Daily  . ipratropium-albuterol  3 mL Nebulization Q6H  . loratadine  10 mg Oral Daily  . methylPREDNISolone (SOLU-MEDROL) injection  60 mg Intravenous Q12H  .  metoprolol tartrate  100 mg Oral BID  . montelukast  10 mg Oral QHS  . multivitamin with minerals  1 tablet Oral Daily  . omega-3 acid ethyl esters  1,000 mg Oral Daily  . pantoprazole  40 mg Oral Q0600  . pneumococcal 23 valent vaccine  0.5 mL Intramuscular Tomorrow-1000  . pravastatin  40 mg Oral QHS  . traZODone  100 mg Oral QHS  . umeclidinium bromide  1 puff Inhalation Daily   Continuous Infusions: . cefTRIAXone (ROCEPHIN)  IV 1 g (10/09/20 0540)     LOS: 2 days      Huey Bienenstock, MD [facility]   10/09/2020, 11:48 AM

## 2020-10-09 NOTE — Evaluation (Signed)
Occupational Therapy Evaluation Patient Details Name: Julia Henderson MRN: [id number] DOB: January 08, 1961 Today's Date: 10/09/2020    History of Present Illness 60 yo female with onset of acute respiratory failure on chronic home O2 was brought to ED.  Pt is SOB even for baseline, new polyuria, coughing and has HA with productive cough.  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  PMHx:  bronchitis, HTN, COPD, Covid,   Clinical Impression   PTA, pt lives with family and reports minor assistance with LB ADLs from daughter, able to ambulate without AD in the home. Pt reports wearing supplemental O2 prn at home. Pt presents now with deficits in cardiopulmonary tolerance, endurance, strength and standing balance. Pt motivated to return to independence and collaborative throughout on energy conservation education, problem-solving use of AE for maximizing independence with dressing and toileting tasks with plans to have pt return demo AE use during next session. Pt able to demo mobility in room without AD at min guard, requires Min A for UB ADLs and Mod A for LB ADLs. Recommend bariatric BSC for shower use and Rollator at DC for energy conservation in the community.  Pt received on 2 L O2, spO2 95% at rest. With short mobility, O2 at 87% on 2 L O2. Pt removed O2 while brushing teeth at sink with desats to 80% and 3/4 DOE noted. With seated rest break, pursed lip breathing and use of 2 L O2, pt returned to 94% quickly.    Follow Up Recommendations  Home health OT;Supervision/Assistance - 24 hour    Equipment Recommendations  3 in 1 bedside commode;Other (comment) (Rollator; bariatric DME)    Recommendations for Other Services       Precautions / Restrictions Precautions Precautions: Fall Precaution Comments: monitor O2 sats Restrictions Weight Bearing Restrictions: No      Mobility Bed Mobility               General bed mobility comments: received in  recliner    Transfers Overall transfer level: Needs assistance Equipment used: None Transfers: Sit to/from Stand Sit to Stand: Supervision         General transfer comment: supervision for sit to stand from chair, min guard for mobility without AD to sink, some furniture walking noted though no LOB    Balance Overall balance assessment: Needs assistance Sitting-balance support: Feet supported Sitting balance-Leahy Scale: Good     Standing balance support: Single extremity supported;No upper extremity supported;During functional activity Standing balance-Leahy Scale: Fair Standing balance comment: static stance without UE support, prefers UE support for balance/fatigue                           ADL either performed or assessed with clinical judgement   ADL Overall ADL's : Needs assistance/impaired Eating/Feeding: Independent;Sitting   Grooming: Supervision/safety;Standing;Oral care Grooming Details (indicate cue type and reason): Supervision for brushing teeth, with cues for O2 need after desats when pt removed during task. Upper Body Bathing: Set up;Sitting   Lower Body Bathing: Moderate assistance;Sit to/from stand   Upper Body Dressing : Minimal assistance;Sitting Upper Body Dressing Details (indicate cue type and reason): Reports difficulty with donning bras at baseline Lower Body Dressing: Moderate assistance;Sit to/from stand   Toilet Transfer: Min guard;Ambulation   Toileting- Clothing Manipulation and Hygiene: Minimal assistance;Sitting/lateral lean;Sit to/from stand       Functional mobility during ADLs: Min guard General ADL Comments: Provided energy conservation handout with  education on use of BSC as shower chair, began education on use of AE for LB dressing tasks, educated on toileting aids to increase ability to reach for efficient peri care or use of bidet attachment at home. Front close bra to ease task.     Vision Patient Visual Report: No  change from baseline Vision Assessment?: No apparent visual deficits     Perception     Praxis      Pertinent Vitals/Pain Pain Assessment: No/denies pain     Hand Dominance Right   Extremity/Trunk Assessment Upper Extremity Assessment Upper Extremity Assessment: RUE deficits/detail;LUE deficits/detail RUE Sensation: history of peripheral neuropathy RUE Coordination: decreased fine motor LUE Sensation: history of peripheral neuropathy LUE Coordination: decreased fine motor   Lower Extremity Assessment Lower Extremity Assessment: Defer to PT evaluation   Cervical / Trunk Assessment Cervical / Trunk Assessment: Kyphotic   Communication Communication Communication: No difficulties   Cognition Arousal/Alertness: Awake/alert Behavior During Therapy: WFL for tasks assessed/performed;Anxious Overall Cognitive Status: Within Functional Limits for tasks assessed                                 General Comments: A&Ox4, pleasant and motivated to improve. 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 Quickly recovers to 94% with seated rest break on 2 L O2.    Exercises     Shoulder Instructions      Home Living Family/patient expects to be discharged to:: Private residence Living Arrangements: Children Available Help at Discharge: Family;Available 24 hours/day Type of Home: Apartment Home Access: Stairs to enter Entrance Stairs-Number of Steps: 2 Entrance Stairs-Rails: Right Home Layout: One level     Bathroom Shower/Tub: Chief Strategy Officer: Standard     Home Equipment: Environmental consultant - 2 wheels;Cane - single point;Adaptive equipment Adaptive Equipment: Reacher;Long-handled shoe horn Additional Comments: using regular chair without armrests as shower chair       Prior Functioning/Environment Level of Independence: Needs assistance  Gait / Transfers Assistance Needed: able to ambulate without AD in the home or use of cane. reports walker will not fit in the home ADL's / Homemaking Assistance Needed: Typically able to bathe self and step into tub though difficult. Uses adaptive strategies for LB bathing peri area. Difficulty donning socks, underwear, pants and shoes - daughter assists with this at times. Difficulty donning sports bra   Comments: Used O2 prn at home, did not need O2 prior to COVID        OT Problem List: Decreased strength;Decreased activity tolerance;Impaired balance (sitting and/or standing);Cardiopulmonary status limiting activity;Decreased knowledge of use of DME or AE      OT Treatment/Interventions: Self-care/ADL training;Therapeutic exercise;Energy conservation;DME and/or AE instruction;Therapeutic activities;Patient/family education;Balance training    OT Goals(Current goals can be found in the care plan section) Acute Rehab OT Goals Patient Stated Goal: Get back to how i was before COVID OT Goal Formulation: With patient Time For Goal Achievement: 10/23/20 Potential to Achieve Goals: Good ADL Goals Pt Will Perform Lower Body Dressing: with modified independence;with adaptive equipment;sit to/from stand;sitting/lateral leans Pt Will Transfer to Toilet: with modified independence;ambulating Pt/caregiver will Perform Home Exercise Program: Increased strength;Both right and left upper extremity;With theraputty;With theraband;Independently;With  written HEP provided Additional ADL Goal #1: Pt to verbalize at least 3 energy conservation strategies to implement during ADLs/IADLs and mobility Additional ADL Goal #2: Pt to demo ability to monitor and implement pursed lip breathing independently to maintain SpO2 90% and above  OT Frequency: Min 2X/week   Barriers to D/C:            Co-evaluation              AM-PAC  OT "6 Clicks" Daily Activity     Outcome Measure Help from another person eating meals?: None Help from another person taking care of personal grooming?: A Little Help from another person toileting, which includes using toliet, bedpan, or urinal?: A Little Help from another person bathing (including washing, rinsing, drying)?: A Lot Help from another person to put on and taking off regular upper body clothing?: A Little Help from another person to put on and taking off regular lower body clothing?: A Lot 6 Click Score: 17   End of Session Equipment Utilized During Treatment: Oxygen Nurse Communication: Mobility status;Other (comment) (O2)  Activity Tolerance: Patient tolerated treatment well Patient left: in chair;with call bell/phone within reach  OT Visit Diagnosis: Unsteadiness on feet (R26.81);Muscle weakness (generalized) (M62.81)                Time: 0240-9735 OT Time Calculation (min): 37 min Charges:  OT General Charges $OT Visit: 1 Visit OT Evaluation $OT Eval Moderate Complexity: 1 Mod OT Treatments $Self Care/Home Management : 8-22 mins  Bradd Canary, OTR/L Acute Rehab Services Office: [phone]  Lorre Munroe 10/09/2020, 10:46 AM

## 2020-10-09 NOTE — Progress Notes (Signed)
OT Cancellation Note  Patient Details Name: Julia Henderson MRN: [id number] DOB: 04-27-1961   Cancelled Treatment:    Reason Eval/Treat Not Completed: Other (comment) On entry, pt sitting EOB eating breakfast. Hope to attempt OT eval again this AM after pt completes breakfast  Lorre Munroe 10/09/2020, 8:44 AM

## 2020-10-09 NOTE — Progress Notes (Signed)
Ok to give a dose of Lokelma 10g x1 per Dr. Randol Kern due to k+ 5.4.  Ulyses Southward, PharmD, BCIDP, AAHIVP, CPP Infectious Disease Pharmacist 10/09/2020 8:56 AM

## 2020-10-09 NOTE — Progress Notes (Signed)
Order to educate patient on insulin injection for new diabetics. Education complete. Patient demonstrated learning with teach back and self injection demonstration. 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

## 2020-10-10 LAB — TSH: TSH: 1.093 u[IU]/mL (ref 0.350–4.500)

## 2020-10-10 LAB — BASIC METABOLIC PANEL
Anion gap: 7 (ref 5–15)
BUN: 55 mg/dL — ABNORMAL HIGH (ref 8–23)
CO2: 29 mmol/L (ref 22–32)
Calcium: 9.3 mg/dL (ref 8.9–10.3)
Chloride: 97 mmol/L — ABNORMAL LOW (ref 98–111)
Creatinine, Ser: 1.33 mg/dL — ABNORMAL HIGH (ref 0.44–1.00)
GFR, Estimated: 46 mL/min — ABNORMAL LOW (ref >60)
Glucose, Bld: 187 mg/dL — ABNORMAL HIGH (ref 70–99)
Potassium: 4.8 mmol/L (ref 3.5–5.1)
Sodium: 133 mmol/L — ABNORMAL LOW (ref 135–145)

## 2020-10-10 LAB — BRAIN NATRIURETIC PEPTIDE: B Natriuretic Peptide: 57 pg/mL (ref 0.0–100.0)

## 2020-10-10 LAB — GLUCOSE, CAPILLARY
Glucose-Capillary: 231 mg/dL — ABNORMAL HIGH (ref 70–99)
Glucose-Capillary: 211 mg/dL — ABNORMAL HIGH (ref 70–99)
Glucose-Capillary: 278 mg/dL — ABNORMAL HIGH (ref 70–99)
Glucose-Capillary: 228 mg/dL — ABNORMAL HIGH (ref 70–99)

## 2020-10-10 LAB — T4, FREE: Free T4: 0.72 ng/dL (ref 0.61–1.12)

## 2020-10-10 MED ORDER — FUROSEMIDE 20 MG PO TABS
20.0000 mg | ORAL_TABLET | Freq: Every day | ORAL | Status: DC
  Administered 2020-10-10: 20 mg via ORAL
  Filled 2020-10-10: qty 1

## 2020-10-10 MED ORDER — FUROSEMIDE 10 MG/ML IJ SOLN
20.0000 mg | Freq: Once | INTRAMUSCULAR | Status: AC
  Administered 2020-10-10: 20 mg via INTRAVENOUS
  Filled 2020-10-10: qty 2

## 2020-10-10 MED ORDER — FUROSEMIDE 10 MG/ML IJ SOLN: 40.0000 mg | Freq: Once | INTRAMUSCULAR | Status: DC

## 2020-10-10 MED ORDER — SODIUM CHLORIDE 0.9 % IV SOLN
2.0000 g | Freq: Every day | INTRAVENOUS | Status: AC
  Administered 2020-10-11: 2 g via INTRAVENOUS
  Filled 2020-10-10: qty 20

## 2020-10-10 MED ORDER — PHENOL 1.4 % MT LIQD: 1.0000 | OROMUCOSAL | Status: DC | PRN

## 2020-10-10 NOTE — Plan of Care (Signed)

## 2020-10-10 NOTE — Progress Notes (Addendum)
Physical Therapy Treatment Patient Details Name: Julia Henderson MRN: [id number] DOB: 1961-04-03 Today's Date: 10/10/2020    History of Present Illness 60 yo female with onset of acute respiratory failure on chronic home O2 was brought to ED.  Pt is SOB even for baseline, new polyuria, coughing and has HA with productive cough.  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  PMHx:  bronchitis, HTN, COPD, Covid,    PT Comments    Today's skilled session focused on gait training with rollator. Adjusted rollator to appropriate height and talked through safety with breaks. Pt able to demonstrate use of breaks for transfers, but required frequent cues. Pt able to ambulate short distance in room, but distance limisted secondary to SOB and coughing. RN notified that pt is requesting cough suppressant.   Will continue to follow acutely.    Follow Up Recommendations  Home health PT;Supervision for mobility/OOB (would benefit from progression to OP aquatic PT)     Equipment Recommendations  Rolling walker with 5" wheels (rollator)    Recommendations for Other Services       Precautions / Restrictions Precautions Precautions: Fall Precaution Comments: monitor O2 sats Restrictions Weight Bearing Restrictions: No Other Position/Activity Restrictions: standing postures desat quickly    Mobility  Bed Mobility Overal bed mobility: Modified Independent             General bed mobility comments: received in recliner    Transfers Overall transfer level: Needs assistance Equipment used: 4-wheeled walker Transfers: Sit to/from Stand Sit to Stand: Min guard         General transfer comment: cues for hand placement and breaks as it was pt first time using rollator.  Ambulation/Gait Ambulation/Gait assistance: Min guard Gait Distance (Feet): 20 Feet Assistive device: 4-wheeled walker Gait Pattern/deviations: Step-to pattern;Step-through  pattern;Decreased stride length;Wide base of support Gait velocity: reduced Gait velocity interpretation: <1.31 ft/sec, indicative of household ambulator General Gait Details: Pt agreeable to short distance ambulation in room. Fatigues quickly. Cues for safety and technique with rollator.   Stairs             Wheelchair Mobility    Modified Rankin (Stroke Patients Only)       Balance Overall balance assessment: Needs assistance Sitting-balance support: Feet supported Sitting balance-Leahy Scale: Good     Standing balance support: Single extremity supported;No upper extremity supported;During functional activity Standing balance-Leahy Scale: Fair Standing balance comment: static stance without UE support, prefers UE support for balance/fatigue                            Cognition Arousal/Alertness: Awake/alert Behavior During Therapy: WFL for tasks assessed/performed;Anxious Overall Cognitive Status: Within Functional Limits for tasks assessed                                 General Comments: A&Ox4, pleasant and motivated to improve. 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 Spo2 decreased to low 90's with activity      Pertinent Vitals/Pain Pain Assessment: No/denies pain    Home Living                      Prior Function  PT Goals (current goals can now be found in the care plan section) Acute Rehab PT Goals Patient Stated Goal: Get back to how i was before COVID PT Goal Formulation: With patient/family Time For Goal Achievement: 10/21/20 Potential to Achieve Goals: Good Progress towards PT goals: Progressing toward goals    Frequency    Min 3X/week      PT Plan Current plan remains appropriate    Co-evaluation              AM-PAC PT "6 Clicks" Mobility   Outcome  Measure  Help needed turning from your back to your side while in a flat bed without using bedrails?: A Little Help needed moving from lying on your back to sitting on the side of a flat bed without using bedrails?: A Little Help needed moving to and from a bed to a chair (including a wheelchair)?: A Little Help needed standing up from a chair using your arms (e.g., wheelchair or bedside chair)?: A Little Help needed to walk in hospital room?: A Little Help needed climbing 3-5 steps with a railing? : A Lot 6 Click Score: 17    End of Session Equipment Utilized During Treatment: Gait belt;Oxygen Activity Tolerance: Patient limited by fatigue;Patient tolerated treatment well Patient left: with call bell/phone within reach;in chair Nurse Communication: Mobility status PT Visit Diagnosis: Unsteadiness on feet (R26.81);Dizziness and giddiness (R42);Muscle weakness (generalized) (M62.81)     Time: 1937-9024 PT Time Calculation (min) (ACUTE ONLY): 23 min  Charges:  $Gait Training: 8-22 mins $Therapeutic Activity: 8-22 mins                     Kallie Locks, [place] Pager [phone] Acute Rehab   Sheral Apley 10/10/2020, 12:50 PM

## 2020-10-10 NOTE — Progress Notes (Signed)
Occupational Therapy Treatment Patient Details Name: Julia Henderson MRN: [id number] DOB: 1961/02/25 Today's Date: 10/10/2020    History of present illness 60 yo female with onset of acute respiratory failure on chronic home O2 was brought to ED.  Pt is SOB even for baseline, new polyuria, coughing and has HA with productive cough.  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  PMHx:  bronchitis, HTN, COPD, Covid,   OT comments  Pt progressing well towards OT goals. Session focused on AE education for LB ADLs. Educated on use of toileting tongs to improve ability to reach for thorough peri care with pt verbalizing understanding of techniques. Guided pt in LB dressing using sock aid with pt able to return demo well. Trialed use of dressing stick for donning smaller pants, undergarments with cues for technique. OT and pt agree that use of [facility] for these items may be the easiest due to larger hook. Pt enthusiastic about this education and appreciative to be issued AE. Reinforced energy conservation strategies and gradual progression of activity tolerance.    Follow Up Recommendations  Home health OT;Supervision - Intermittent    Equipment Recommendations  3 in 1 bedside commode;Other (comment) (Rollator; bariatric DME)    Recommendations for Other Services      Precautions / Restrictions Precautions Precautions: Fall Precaution Comments: monitor O2 sats Restrictions Weight Bearing Restrictions: No       Mobility Bed Mobility               General bed mobility comments: received in recliner    Transfers Overall transfer level: Needs assistance Equipment used: None Transfers: Sit to/from Stand Sit to Stand: Supervision              Balance Overall balance assessment: Needs assistance Sitting-balance support: Feet supported Sitting balance-Leahy Scale: Good     Standing balance support: Single extremity supported;No upper extremity  supported;During functional activity Standing balance-Leahy Scale: Fair                             ADL either performed or assessed with clinical judgement   ADL Overall ADL's : Needs assistance/impaired                     Lower Body Dressing: Supervision/safety;With adaptive equipment;Sitting/lateral leans;Sit to/from stand Lower Body Dressing Details (indicate cue type and reason): Based on previous session, brought AE for practice with LB dressing. After initial instruction, pt able to return demo use of sock aid accurately. Demo use of dressing stick for donning pants, underwear but some difficulty due to smaller hook. Both OT and pt believe pt will be able to don underwear more easily with single point cane at home. Pt able to don larger pants easily today without AE - reports most difficulty with smaller pants and underwear. Issued sock aide Toilet Transfer: Supervision/safety;Ambulation;BSC Toilet Transfer Details (indicate cue type and reason): Assessed fit of BSC for pt that was provided for use at home (plans to trial as shower chair). BSC in pt room is slightly wider than BSC issued to pt though pt may be able to comfortably use at home. Educated that she may can remove one armrest if BSC too tight.   Toileting - Clothing Manipulation Details (indicate cue type and reason): Educated and issued toileting tongs for pt to trial at home for increased ability to reach for thorough peri care.  Functional mobility during ADLs: Supervision/safety General ADL Comments: Session focused on AE education and continue energy conservation reinforcement     Vision   Vision Assessment?: No apparent visual deficits   Perception     Praxis      Cognition Arousal/Alertness: Awake/alert Behavior During Therapy: WFL for tasks assessed/performed;Anxious Overall Cognitive Status: Within Functional Limits for tasks assessed                                  General Comments: A&Ox4, pleasant and motivated to improve. 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, trade union Comments received on 2 L O2, 2/4 DOE with activity, SpO2 >88%    Pertinent Vitals/ Pain       Pain Assessment: No/denies pain  Home Living                                          Prior Functioning/Environment              Frequency  Min 2X/week        Progress Toward Goals  OT Goals(current goals can now be found in the care plan section)  Progress towards OT goals: Progressing toward goals  Acute Rehab OT Goals Patient Stated Goal: Get back to how i was before COVID OT Goal Formulation: With patient Time For Goal Achievement: 10/23/20 Potential to Achieve Goals: Good ADL Goals Pt Will Perform Lower Body Dressing: with modified independence;with adaptive equipment;sit to/from stand;sitting/lateral leans Pt Will Transfer to Toilet: with modified independence;ambulating Pt/caregiver will Perform Home Exercise Program: Increased strength;Both right and left upper extremity;With theraputty;With theraband;Independently;With written HEP provided Additional ADL Goal #1: Pt to verbalize at least 3 energy conservation strategies to implement during ADLs/IADLs and mobility Additional ADL Goal #2: Pt to demo ability to monitor and implement pursed lip breathing independently to maintain SpO2 90% and above  Plan Discharge plan remains appropriate    Co-evaluation                 AM-PAC OT "6 Clicks" Daily Activity     Outcome Measure   Help from another person eating meals?: None Help from another person taking care of personal grooming?: None Help from another person toileting, which includes using toliet, bedpan, or urinal?: A Little Help from another person bathing (including washing, rinsing, drying)?: A Lot Help from another person to put on and taking  off regular upper body clothing?: A Little Help from another person to put on and taking off regular lower body clothing?: A Little 6 Click Score: 19    End of Session Equipment Utilized During Treatment: Oxygen  OT Visit Diagnosis: Unsteadiness on feet (R26.81);Muscle weakness (generalized) (M62.81)   Activity Tolerance Patient tolerated treatment well   Patient Left in chair;with call bell/phone within reach   Nurse Communication          Time: 2119-4174 OT Time Calculation (min): 25 min  Charges: OT General Charges $OT Visit: 1 Visit OT Treatments $Self Care/Home Management : 23-37 mins  Bradd Canary, OTR/L Acute Rehab Services Office: [phone]   Lorre Munroe 10/10/2020, 10:22 AM

## 2020-10-10 NOTE — Progress Notes (Signed)
PROGRESS NOTE    Julia Henderson  GYI:[id number] DOB: 1960-11-15 DOA: 10/06/2020 PCP: Patient, No Pcp Per    Chief Complaint  Patient presents with  . Shortness of Breath    Brief Narrative:   Patient 60 year old morbidly obese female history of COPD/chronic bronchitis, hypertension, hyperlipidemia, recent hospitalization 08/24/2020 at [facility] for COVID-19 infection noted at time with shortness of breath and discharged home on 4 L home O2 as needed. Patient presented to the ED with a 4-day history of productive cough of greenish sputum, body aches, chills, headache, worsening shortness of breath which has progressed. On presentation to the ED patient noted with severe wheezing with some improvement with nebulizer treatments and steroids.  Blood glucose level noted to be 420.  D-dimer elevated at 0.57. CT angiogram chest ordered however patient refused until daughter could be here due to anxiety. Patient admitted for acute COPD exacerbation versus post Covid pulmonary fibrosis versus PE..  Subjective:  Reports dyspnea has improved, denies any fever, chills or chest pain.     Assessment & Plan:   Principal Problem:   Acute respiratory failure with hypoxia (HCC) Active Problems:   HTN (hypertension)   Morbid obesity (HCC)   DM2 (diabetes mellitus, type 2) (HCC)  1 acute respiratory failure with hypoxia/acute COPD exacerbation with bronchitis -Likely secondary to acute COPD exacerbation with bronchitis.   -D-dimers was elevated, so CTA chest was obtained, it is negative for PE or dissection, no infiltrate . -Patient with cough of productive sputum with associated wheezing with coughing. -Continue Pulmicort nebs twice daily, scheduled duo nebs, Claritin, Flonase, PPI. -Continue to taper IV steroids, likely with transition to prednisone tomorrow. -Continue IV Rocephin to complete a 5-day course. -DC Dulera. -Change full dose Lovenox to prophylactic dose Lovenox. -Supportive  care. -I have discussed with the patient, likely she is having undiagnosed OSA, so recommended to follow-up as an outpatient regarding sleep study. -Denies history of smoking, but reports secondhand smoking . -We will decrease IV steroids, will try to taper oxygen, have decreased from 4 to 2 L nasal cannula this morning. -I have encouraged her to get out of bed to chair, and use incentive spirometer today.  2.  Hypertension Continue Norvasc 5 mg daily.  DC spironolactone.  Especially with hyperkalemia.  3.  Newly diagnosed diabetes mellitus type 2 -Patient states does not have a history of diabetes. -Hemoglobin A1c 10.3 (10/07/2020). -CBG under better controlled currently on Lantus 25 units after tapering her steroids and starting Metformin.   -Continue resistant SSI.   -continue with meal coverage NovoLog 8 units 3 times daily.   -Diabetic coordinator consulted and is following.  4.  UTI -Urinalysis with moderate leukocytes, nitrite negative, WBC > 50 -Urine cultures with multiple species.   -On IV Rocephin.  5.  Hyperkalemia -Resolved with Lokelma, avoid Aldactone.    6.  Morbid obesity -We will check free T4 and TSH.    DVT prophylaxis: Lovenox Code Status: Full Family Communication: Updated patient.  No family at bedside. Disposition:   Status is: Inpatient    Dispo: The patient is from: Home              Anticipated d/c is to: Home              Patient currently on IV antibiotics, IV steroids, nebulizer treatments.not stable for discharge.    Difficult to place patient now       Consultants:   None  Procedures:  CT angiogram chest 10/07/2020  Chest x-ray 10/06/2020    Antimicrobials:   IV Rocephin 10/07/2020>>>>      Objective: Vitals:   10/09/20 2000 10/09/20 2005 10/10/20 0452 10/10/20 0858  BP:  105/74 114/63   Pulse:  100 99 83  Resp:  20 19 20   Temp:  98.3 F (36.8 C) 98.5 F (36.9 C)   TempSrc:  Axillary Axillary   SpO2: 97% 97% 96%  99%  Weight:       No intake or output data in the 24 hours ending 10/10/20 1202 Filed Weights   10/07/20 0832  Weight: 121.8 kg    Examination:   Awake Alert, Oriented X 3, No new F.N deficits, Normal affect Symmetrical Chest wall movement, improved air entry at the bases, les wheezing today.   RRR,No Gallops,Rubs or new Murmurs, No Parasternal Heave +ve B.Sounds, Abd Soft, No tenderness, No rebound - guarding or rigidity. No Cyanosis, Clubbing or edema, No new Rash or bruise      Data Reviewed: I have personally reviewed following labs and imaging studies  CBC: Recent Labs  Lab 10/06/20 1852 10/07/20 0207 10/07/20 0425 10/08/20 0200 10/09/20 0121  WBC 7.4  --  11.8* 12.5* 16.8*  NEUTROABS  --   --   --  10.3* 14.0*  HGB 12.7 14.3 13.4 13.0 12.6  HCT 42.6 42.0 44.3 43.3 41.5  MCV 96.6  --  96.7 96.0 96.7  PLT 223  --  247 264 258    Basic Metabolic Panel: Recent Labs  Lab 10/06/20 1852 10/07/20 0207 10/07/20 0425 10/07/20 2052 10/08/20 0200 10/09/20 0121 10/10/20 0504  NA 137 138 135  --  134* 132* 133*  K 4.5 3.8 4.5  --  5.2* 5.4* 4.8  CL 98  --  97*  --  94* 97* 97*  CO2 32  --  26  --  30 27 29   GLUCOSE 420*  --  398* 433* 324* 259* 187*  BUN 13  --  16  --  33* 47* 55*  CREATININE 0.82  --  1.22*  --  1.28* 1.18* 1.33*  CALCIUM 9.4  --  9.1  --  9.7 9.2 9.3  MG  --   --   --   --  1.9 2.0  --     GFR: Estimated Creatinine Clearance: 54.3 mL/min (A) (by C-G formula based on SCr of 1.33 mg/dL (H)).  Liver Function Tests: Recent Labs  Lab 10/07/20 0023  AST 62*  ALT 43  ALKPHOS 58  BILITOT 0.6  PROT 7.7  ALBUMIN 3.7    CBG: Recent Labs  Lab 10/09/20 0741 10/09/20 1147 10/09/20 1711 10/09/20 2004 10/10/20 0754  GLUCAP 331* 407* 413* 354* 231*     Recent Results (from the past 240 hour(s))  Respiratory (~20 pathogens) panel by PCR     Status: Abnormal   Collection Time: 10/07/20  3:14 AM   Specimen: Nasopharyngeal Swab;  Respiratory  Result Value Ref Range Status   Adenovirus NOT DETECTED NOT DETECTED Final   Coronavirus 229E NOT DETECTED NOT DETECTED Final    Comment: (NOTE) The Coronavirus on the Respiratory Panel, DOES NOT test for the novel  Coronavirus (2019 nCoV)    Coronavirus HKU1 NOT DETECTED NOT DETECTED Final   Coronavirus NL63 NOT DETECTED NOT DETECTED Final   Coronavirus OC43 NOT DETECTED NOT DETECTED Final   Metapneumovirus NOT DETECTED NOT DETECTED Final   Rhinovirus / Enterovirus DETECTED (A) NOT DETECTED Final   Influenza A NOT DETECTED NOT  DETECTED Final   Influenza B NOT DETECTED NOT DETECTED Final   Parainfluenza Virus 1 NOT DETECTED NOT DETECTED Final   Parainfluenza Virus 2 NOT DETECTED NOT DETECTED Final   Parainfluenza Virus 3 NOT DETECTED NOT DETECTED Final   Parainfluenza Virus 4 NOT DETECTED NOT DETECTED Final   Respiratory Syncytial Virus NOT DETECTED NOT DETECTED Final   Bordetella pertussis NOT DETECTED NOT DETECTED Final   Bordetella Parapertussis NOT DETECTED NOT DETECTED Final   Chlamydophila pneumoniae NOT DETECTED NOT DETECTED Final   Mycoplasma pneumoniae NOT DETECTED NOT DETECTED Final    Comment: Performed at [facility], [address], [place], [place] [address]  Culture, Urine     Status: Abnormal   Collection Time: 10/07/20  5:40 PM   Specimen: Urine, Random  Result Value Ref Range Status   Specimen Description URINE, RANDOM  Final   Special Requests   Final    NONE Performed at [facility], [address], [place], [place] [address]    Culture MULTIPLE SPECIES PRESENT, SUGGEST RECOLLECTION (A)  Final   Report Status 10/08/2020 FINAL  Final         Radiology Studies: No results found.      Scheduled Meds: . amLODipine  5 mg Oral BID  . vitamin C  500 mg Oral Daily  . aspirin EC  81 mg Oral Daily  . budesonide (PULMICORT) nebulizer solution  0.5 mg Nebulization BID  . cholecalciferol  5,000 Units Oral Daily  . enoxaparin  (LOVENOX) injection  60 mg Subcutaneous Q24H  . fluticasone  2 spray Each Nare Daily  . furosemide  20 mg Oral Daily  . gabapentin  900 mg Oral TID  . guaiFENesin  1,200 mg Oral BID  . insulin aspart  0-20 Units Subcutaneous TID WC  . insulin aspart  0-5 Units Subcutaneous QHS  . insulin aspart  8 Units Subcutaneous TID WC  . insulin glargine  25 Units Subcutaneous Daily  . ipratropium-albuterol  3 mL Nebulization BID  . loratadine  10 mg Oral Daily  . metFORMIN  1,000 mg Oral BID WC  . methylPREDNISolone (SOLU-MEDROL) injection  40 mg Intravenous Daily  . metoprolol tartrate  100 mg Oral BID  . montelukast  10 mg Oral QHS  . multivitamin with minerals  1 tablet Oral Daily  . omega-3 acid ethyl esters  1,000 mg Oral Daily  . pantoprazole  40 mg Oral Q0600  . pneumococcal 23 valent vaccine  0.5 mL Intramuscular Tomorrow-1000  . pravastatin  40 mg Oral QHS  . traZODone  100 mg Oral QHS  . umeclidinium bromide  1 puff Inhalation Daily   Continuous Infusions: . [START ON 10/11/2020] cefTRIAXone (ROCEPHIN)  IV       LOS: 3 days      Huey Bienenstock, MD [facility]   10/10/2020, 12:02 PM

## 2020-10-11 ENCOUNTER — Other Ambulatory Visit: Payer: Self-pay | Admitting: Internal Medicine

## 2020-10-11 LAB — BASIC METABOLIC PANEL
Anion gap: 11 (ref 5–15)
BUN: 57 mg/dL — ABNORMAL HIGH (ref 8–23)
CO2: 27 mmol/L (ref 22–32)
Calcium: 9.2 mg/dL (ref 8.9–10.3)
Chloride: 97 mmol/L — ABNORMAL LOW (ref 98–111)
Creatinine, Ser: 1.56 mg/dL — ABNORMAL HIGH (ref 0.44–1.00)
GFR, Estimated: 38 mL/min — ABNORMAL LOW (ref >60)
Glucose, Bld: 256 mg/dL — ABNORMAL HIGH (ref 70–99)
Potassium: 4.4 mmol/L (ref 3.5–5.1)
Sodium: 135 mmol/L (ref 135–145)

## 2020-10-11 LAB — CBC
HCT: 40.4 % (ref 36.0–46.0)
Hemoglobin: 12.5 g/dL (ref 12.0–15.0)
MCH: 28.9 pg (ref 26.0–34.0)
MCHC: 30.9 g/dL (ref 30.0–36.0)
MCV: 93.5 fL (ref 80.0–100.0)
Platelets: 237 10*3/uL (ref 150–400)
RBC: 4.32 MIL/uL (ref 3.87–5.11)
RDW: 17.6 % — ABNORMAL HIGH (ref 11.5–15.5)
WBC: 11.2 10*3/uL — ABNORMAL HIGH (ref 4.0–10.5)
nRBC: 0 % (ref 0.0–0.2)

## 2020-10-11 LAB — GLUCOSE, CAPILLARY
Glucose-Capillary: 214 mg/dL — ABNORMAL HIGH (ref 70–99)
Glucose-Capillary: 161 mg/dL — ABNORMAL HIGH (ref 70–99)

## 2020-10-11 MED ORDER — FISH OIL 1000 MG PO CAPS: 1000.0000 mg | ORAL_CAPSULE | Freq: Every day | ORAL | 0 refills | Status: DC

## 2020-10-11 MED ORDER — MONTELUKAST SODIUM 10 MG PO TABS: 10.0000 mg | ORAL_TABLET | Freq: Every day | ORAL | 0 refills | Status: AC

## 2020-10-11 MED ORDER — PANTOPRAZOLE SODIUM 40 MG PO TBEC: 40.0000 mg | DELAYED_RELEASE_TABLET | Freq: Every day | ORAL | 0 refills | Status: DC

## 2020-10-11 MED ORDER — METOPROLOL TARTRATE 100 MG PO TABS: 100.0000 mg | ORAL_TABLET | Freq: Two times a day (BID) | ORAL | 0 refills | Status: DC

## 2020-10-11 MED ORDER — ALBUTEROL SULFATE HFA 108 (90 BASE) MCG/ACT IN AERS: 2.0000 | INHALATION_SPRAY | RESPIRATORY_TRACT | 0 refills | Status: DC | PRN

## 2020-10-11 MED ORDER — INSULIN PEN NEEDLE 29G X 5MM MISC: 0 refills | Status: DC

## 2020-10-11 MED ORDER — TRAZODONE HCL 100 MG PO TABS: 100.0000 mg | ORAL_TABLET | Freq: Every day | ORAL | 0 refills | Status: AC

## 2020-10-11 MED ORDER — BLOOD GLUCOSE MONITOR KIT: PACK | 0 refills | Status: AC

## 2020-10-11 MED ORDER — GABAPENTIN 300 MG PO CAPS: 900.0000 mg | ORAL_CAPSULE | Freq: Three times a day (TID) | ORAL | 0 refills | Status: AC

## 2020-10-11 MED ORDER — INSULIN GLARGINE 100 UNIT/ML SOLOSTAR PEN: 25.0000 [IU] | PEN_INJECTOR | Freq: Every day | SUBCUTANEOUS | 1 refills | Status: DC

## 2020-10-11 MED ORDER — VITAMIN D3 125 MCG (5000 UT) PO TABS: 5000.0000 [IU] | ORAL_TABLET | Freq: Every day | ORAL | 0 refills | Status: AC

## 2020-10-11 MED ORDER — PREDNISONE 10 MG (21) PO TBPK: ORAL_TABLET | ORAL | 0 refills | Status: DC

## 2020-10-11 MED ORDER — PRAVASTATIN SODIUM 40 MG PO TABS: 40.0000 mg | ORAL_TABLET | Freq: Every day | ORAL | 0 refills | Status: AC

## 2020-10-11 MED ORDER — GABAPENTIN 300 MG PO CAPS: 900.0000 mg | ORAL_CAPSULE | Freq: Three times a day (TID) | ORAL | 0 refills | Status: DC

## 2020-10-11 MED ORDER — PANTOPRAZOLE SODIUM 40 MG PO TBEC: 40.0000 mg | DELAYED_RELEASE_TABLET | Freq: Every day | ORAL | 0 refills | Status: AC

## 2020-10-11 MED ORDER — TRAZODONE HCL 100 MG PO TABS: 100.0000 mg | ORAL_TABLET | Freq: Every day | ORAL | 0 refills | Status: DC

## 2020-10-11 MED ORDER — ASPIRIN EC 81 MG PO TBEC: 81.0000 mg | DELAYED_RELEASE_TABLET | Freq: Every day | ORAL | 0 refills | Status: DC

## 2020-10-11 MED ORDER — MONTELUKAST SODIUM 10 MG PO TABS: 10.0000 mg | ORAL_TABLET | Freq: Every day | ORAL | 0 refills | Status: DC

## 2020-10-11 MED ORDER — INSULIN PEN NEEDLE 29G X 5MM MISC: 0 refills | Status: AC

## 2020-10-11 MED ORDER — BLOOD GLUCOSE MONITOR KIT: PACK | 0 refills | Status: DC

## 2020-10-11 MED ORDER — METOPROLOL TARTRATE 100 MG PO TABS: 100.0000 mg | ORAL_TABLET | Freq: Two times a day (BID) | ORAL | 0 refills | Status: AC

## 2020-10-11 MED ORDER — PRAVASTATIN SODIUM 40 MG PO TABS: 40.0000 mg | ORAL_TABLET | Freq: Every day | ORAL | 0 refills | Status: DC

## 2020-10-11 MED FILL — LANTUS SOLOSTAR 100 UNITS/M: 100 | 30 days supply | Qty: 9 | Fill #0

## 2020-10-11 MED FILL — ALBUTEROL SULFATE HFA 108 (: 108 (90 BAS | 25 days supply | Qty: 18 | Fill #0

## 2020-10-11 MED FILL — PRAVASTATIN NA 40 MG TAB: 40 | 30 days supply | Qty: 30 | Fill #0

## 2020-10-11 MED FILL — PENTIPS 32G X 4 MM MISC: 32G X 4 MM | 25 days supply | Qty: 100 | Fill #0

## 2020-10-11 MED FILL — TRUE METRIX GLUCOSE TEST ST: 25 days supply | Qty: 100 | Fill #0

## 2020-10-11 MED FILL — PANTOPRAZOLE SOD DR 40 MG T: 40 | 30 days supply | Qty: 30 | Fill #0

## 2020-10-11 MED FILL — TRUEplus LANCETS 28G MISC: 25 days supply | Qty: 100 | Fill #0

## 2020-10-11 MED FILL — MONTELUKAST SOD 10 MG TAB: 10 | 30 days supply | Qty: 30 | Fill #0

## 2020-10-11 MED FILL — METOPROLOL TARTRATE 100 MG: 100 | 30 days supply | Qty: 60 | Fill #0

## 2020-10-11 MED FILL — TRUE METRIX BLOOD GLUCOSE M: W/DEVICE | 1 days supply | Qty: 1 | Fill #0

## 2020-10-11 MED FILL — traZODone HCL 100 MG TABS: 100 | 30 days supply | Qty: 30 | Fill #0

## 2020-10-11 MED FILL — GABAPENTIN 300 MG CAPSULE: 300 | 30 days supply | Qty: 270 | Fill #0

## 2020-10-11 MED FILL — OMEGA-3 ETHYL ESTERS 1 GM C: 1 | 30 days supply | Qty: 30 | Fill #0

## 2020-10-11 MED FILL — predniSONE 10 MG (21) TBPK: 10 | 6 days supply | Qty: 21 | Fill #0

## 2020-10-11 NOTE — Progress Notes (Deleted)
Julia Henderson, is a 60 y.o. female  DOB March 29, 1961  MRN [id number].  Admission date:  10/06/2020  Admitting Physician  Etta Quill, DO  Discharge Date:  10/11/2020   Primary MD  Patient, No Pcp Per  Recommendations for primary care physician for things to follow:  -Please check CBC, CMP during next visit. -Please adjust Lantus dose as needed -Consider lisinopril, once confirmed no recurrent hyperkalemia, and renal function is stable. -Patient will need outpatient sleep study to evaluate for OSA.  Admission Diagnosis  Shortness of breath [R06.02] Hyperglycemia [R73.9] Acute respiratory failure with hypoxia (HCC) [J96.01]   Discharge Diagnosis  Shortness of breath [R06.02] Hyperglycemia [R73.9] Acute respiratory failure with hypoxia ([place]) [J96.01]    Principal Problem:   Acute respiratory failure with hypoxia (HCC) Active Problems:   HTN (hypertension)   Morbid obesity (HCC)   DM2 (diabetes mellitus, type 2) ([place])      Past Medical History:  Diagnosis Date  . Bronchitis   . High cholesterol   . Hypertension     Past Surgical History:  Procedure Laterality Date  . CESAREAN SECTION         History of present illness and  Hospital Course:     Kindly see H&P for history of present illness and admission details, please review complete Labs, Consult reports and Test reports for all details in brief  HPI  from the history and physical done on the day of admission 10/06/2020   HPI: Julia Henderson is a 60 y.o. female with medical history significant of COPD / chronic bronchitis, HTN, HLD, morbid obesity.  Pt admitted to [facility] on 2/5 with COVID-19.  Since that time she has had SOB, wearing up to 4L PRN at home.  For the past 4 days she has had cough, chills, body aches, headache.  Cough productive of yellow sputum.  She has had SOB during that time that has progressively worsened, now  severe.  No abd pain, N/V/D, rash, syncope, orthopnea, leg swelling.  Does have new onset polyuria / polydipsia.  Not previously ever diagnosed with DM.   ED Course: Severe wheezing, improved some with neb treatments and steroids in ED.  BGL on BMP is 420.  (was in the 300s during ED visit last month too).  PT sating 100% on 4L, but remains wheezy and SOB.  D.Dimer 0.57.  Pt refused CT scan until daughter can be here in AM due to anxiety.   Hospital Course    acute respiratory failure with hypoxia/acute COPD exacerbation with bronchitis -Likely secondary to acute COPD exacerbation with bronchitis.   -D-dimers was elevated, so CTA chest was obtained, it is negative for PE or dissection, no infiltrate . -Patient with cough of productive sputum with associated wheezing with coughing. -She was treated with IV steroids, IV rocephin and azithromycin, no further need for antibiotics on discharge, she will be discharged on steroid taper.. -She will be discharged on 2.5 L nasal cannula, she is on oxygen on admission, at baseline 2 to 4  L.. . Hypertension -Blood pressure is acceptable, Aldactone has been stopped given hyperkalemia, as well lisinopril has been stopped given creatinine of 1.5, hyperkalemia, blood pressure is acceptable on metoprolol . -Resume lisinopril as an outpatient if creatinine and potassium level is acceptable.     Newly diagnosed diabetes mellitus type 2 -Patient states does not have a history of diabetes. -Hemoglobin A1c 10.3 (10/07/2020). -she will be discharged on  Lantus 25 units.  UTI -Urinalysis with moderate leukocytes, nitrite negative, WBC > 50 -treated with rocephin   Hyperkalemia -Resolved with Lokelma, avoid Aldactone.     Morbid obesity -Continue counseling about weight loss, TSH and free T4 within normal limit, I have recommended for her to follow as an outpatient for sleep study to rule out OSA, she was counseled about weight  loss.     Discharge Condition:  stable      Discharge Instructions  and  Discharge Medications     Discharge Instructions    Amb Referral to Nutrition and Diabetic Education   Complete by: As directed    Discharge instructions   Complete by: As directed    Follow with Primary MD Patient,  in 7 days   Get CBC, CMP,  checked  by Primary MD next visit.    Activity: As tolerated with Full fall precautions use walker/cane & assistance as needed   Disposition Home    Diet: Heart Healthy /carb modified , with feeding assistance and aspiration precautions.  For Heart failure patients - Check your Weight same time everyday, if you gain over 2 pounds, or you develop in leg swelling, experience more shortness of breath or chest pain, call your Primary MD immediately. Follow Cardiac Low Salt Diet and 1.5 lit/day fluid restriction.   On your next visit with your primary care physician please Get Medicines reviewed and adjusted.   Please request your Prim.MD to go over all Hospital Tests and Procedure/Radiological results at the follow up, please get all Hospital records sent to your Prim MD by signing hospital release before you go home.   If you experience worsening of your admission symptoms, develop shortness of breath, life threatening emergency, suicidal or homicidal thoughts you must seek medical attention immediately by calling 911 or calling your MD immediately  if symptoms less severe.  You Must read complete instructions/literature along with all the possible adverse reactions/side effects for all the Medicines you take and that have been prescribed to you. Take any new Medicines after you have completely understood and accpet all the possible adverse reactions/side effects.   Do not drive, operating heavy machinery, perform activities at heights, swimming or participation in water activities or provide baby sitting services if your were admitted for syncope or siezures  until you have seen by Primary MD or a Neurologist and advised to do so again.  Do not drive when taking Pain medications.    Do not take more than prescribed Pain, Sleep and Anxiety Medications  Special Instructions: If you have smoked or chewed Tobacco  in the last 2 yrs please stop smoking, stop any regular Alcohol  and or any Recreational drug use.  Wear Seat belts while driving.   Please note  You were cared for by a hospitalist during your hospital stay. If you have any questions about your discharge medications or the care you received while you were in the hospital after you are discharged, you can call the unit and asked to speak with the hospitalist on call if the hospitalist  that took care of you is not available. Once you are discharged, your primary care physician will handle any further medical issues. Please note that NO REFILLS for any discharge medications will be authorized once you are discharged, as it is imperative that you return to your primary care physician (or establish a relationship with a primary care physician if you do not have one) for your aftercare needs so that they can reassess your need for medications and monitor your lab values.   Increase activity slowly   Complete by: As directed      Allergies as of 10/11/2020      Reactions   Celexa [citalopram]       Medication List    STOP taking these medications   amLODipine 5 MG tablet Commonly known as: NORVASC   lisinopril 20 MG tablet Commonly known as: ZESTRIL   spironolactone 25 MG tablet Commonly known as: ALDACTONE     TAKE these medications   albuterol 108 (90 Base) MCG/ACT inhaler Commonly known as: VENTOLIN HFA Inhale 2 puffs into the lungs every 4 (four) hours as needed for wheezing or shortness of breath.   aspirin EC 81 MG tablet Take 1 tablet (81 mg total) by mouth daily. Swallow whole.   benzonatate 100 MG capsule Commonly known as: TESSALON Take 1 capsule (100 mg total) by  mouth every 8 (eight) hours as needed for cough.   blood glucose meter kit and supplies Kit Dispense based on patient and insurance preference. Use up to four times daily as directed.   Fish Oil 1000 MG Caps Take 1 capsule (1,000 mg total) by mouth daily.   gabapentin 300 MG capsule Commonly known as: NEURONTIN Take 3 capsules (900 mg total) by mouth 3 (three) times daily.   insulin glargine 100 UNIT/ML Solostar Pen Commonly known as: LANTUS Inject 25 Units into the skin daily.   Insulin Pen Needle 29G X 5MM Misc Use with insulin   metoprolol tartrate 100 MG tablet Commonly known as: LOPRESSOR Take 1 tablet (100 mg total) by mouth 2 (two) times daily.   montelukast 10 MG tablet Commonly known as: SINGULAIR Take 1 tablet (10 mg total) by mouth at bedtime.   multivitamin tablet Take 1 tablet by mouth daily.   pantoprazole 40 MG tablet Commonly known as: PROTONIX Take 1 tablet (40 mg total) by mouth daily at 6 (six) AM. Start taking on: October 12, 2020   pravastatin 40 MG tablet Commonly known as: PRAVACHOL Take 1 tablet (40 mg total) by mouth at bedtime.   predniSONE 10 MG (21) Tbpk tablet Commonly known as: STERAPRED UNI-PAK 21 TAB Use per package instruction   traZODone 100 MG tablet Commonly known as: DESYREL Take 1 tablet (100 mg total) by mouth at bedtime.   vitamin C 500 MG tablet Commonly known as: ASCORBIC ACID Take 500 mg by mouth daily.   Vitamin D3 125 MCG (5000 UT) Tabs Take 1 tablet (5,000 Units total) by mouth daily.   ZyrTEC Allergy 10 MG tablet Generic drug: cetirizine Take 10 mg by mouth daily.            Durable Medical Equipment  (From admission, onward)         Start     Ordered   10/09/20 1116  For home use only DME Other see comment  Once       Comments: Needs bariatric Rolling walker  Question:  Length of Need  Answer:  Lifetime   10/09/20 1117  10/09/20 1114  For home use only DME 3 n 1  Once       Comments: Needs  bariatric Bedside commode   10/09/20 1114   10/08/20 0908  For home use only DME Walker rolling  Once       Comments: Patient needs Rollator  Question Answer Comment  Walker: With 5 Inch Wheels   Patient needs a walker to treat with the following condition Generalized weakness      10/08/20 0908            Diet and Activity recommendation: See Discharge Instructions above   Consults obtained -  None   Major procedures and Radiology Reports - PLEASE review detailed and final reports for all details, in brief -      DG Chest 2 View  Result Date: 10/06/2020 CLINICAL DATA:  60 year old female with shortness of breath. EXAM: CHEST - 2 VIEW COMPARISON:  Chest radiograph dated 09/13/2020. FINDINGS: No focal consolidation, pleural effusion or pneumothorax. Top-normal cardiac size. No acute osseous pathology. IMPRESSION: No active cardiopulmonary disease. Electronically Signed   By: Anner Crete M.D.   On: 10/06/2020 19:35   DG Chest 2 View  Result Date: 09/13/2020 CLINICAL DATA:  60 year old female with history of shortness of breath and productive cough for the past 2 weeks. EXAM: CHEST - 2 VIEW COMPARISON:  Chest x-ray 08/25/2020. FINDINGS: Lung volumes are normal. No consolidative airspace disease. No pleural effusions. No pneumothorax. No pulmonary nodule or mass noted. Pulmonary vasculature and the cardiomediastinal silhouette are within normal limits. IMPRESSION: No radiographic evidence of acute cardiopulmonary disease. Electronically Signed   By: Vinnie Langton M.D.   On: 09/13/2020 15:01   CT ANGIO CHEST PE W OR WO CONTRAST  Result Date: 10/07/2020 CLINICAL DATA:  Shortness of breath EXAM: CT ANGIOGRAPHY CHEST WITH CONTRAST TECHNIQUE: Multidetector CT imaging of the chest was performed using the standard protocol during bolus administration of intravenous contrast. Multiplanar CT image reconstructions and MIPs were obtained to evaluate the vascular anatomy. CONTRAST:   67mL OMNIPAQUE IOHEXOL 350 MG/ML SOLN COMPARISON:  Chest radiograph October 06, 2020 FINDINGS: Cardiovascular: There is no demonstrable pulmonary embolus. There is no thoracic aortic aneurysm or dissection. There are occasional foci of calcification in visualized great vessels. There are occasional foci of coronary artery calcification. There is no pericardial effusion or pericardial thickening. Mediastinum/Nodes: Visualized thyroid appears unremarkable. No evident thoracic adenopathy. No esophageal lesions are appreciable. Lungs/Pleura: There are scattered foci of atelectatic change. No edema or airspace opacity present. No pleural effusions. No pneumothorax. Trachea and major bronchial structures appear unremarkable. Upper Abdomen: Visualized upper abdominal structures appear unremarkable. Musculoskeletal: No blastic or lytic bone lesions. No evident chest wall lesion. Review of the MIP images confirms the above findings. IMPRESSION: 1. No demonstrable pulmonary embolus. No thoracic aortic aneurysm or dissection. There are scattered foci of coronary artery calcification. 2. Scattered areas of atelectasis. No edema or airspace opacity. No evident pleural effusions. 3.  No evident adenopathy. Electronically Signed   By: Lowella Grip III M.D.   On: 10/07/2020 13:33    Micro Results     Recent Results (from the past 240 hour(s))  Respiratory (~20 pathogens) panel by PCR     Status: Abnormal   Collection Time: 10/07/20  3:14 AM   Specimen: Nasopharyngeal Swab; Respiratory  Result Value Ref Range Status   Adenovirus NOT DETECTED NOT DETECTED Final   Coronavirus 229E NOT DETECTED NOT DETECTED Final    Comment: (NOTE) The Coronavirus  on the Respiratory Panel, DOES NOT test for the novel  Coronavirus (2019 nCoV)    Coronavirus HKU1 NOT DETECTED NOT DETECTED Final   Coronavirus NL63 NOT DETECTED NOT DETECTED Final   Coronavirus OC43 NOT DETECTED NOT DETECTED Final   Metapneumovirus NOT DETECTED NOT  DETECTED Final   Rhinovirus / Enterovirus DETECTED (A) NOT DETECTED Final   Influenza A NOT DETECTED NOT DETECTED Final   Influenza B NOT DETECTED NOT DETECTED Final   Parainfluenza Virus 1 NOT DETECTED NOT DETECTED Final   Parainfluenza Virus 2 NOT DETECTED NOT DETECTED Final   Parainfluenza Virus 3 NOT DETECTED NOT DETECTED Final   Parainfluenza Virus 4 NOT DETECTED NOT DETECTED Final   Respiratory Syncytial Virus NOT DETECTED NOT DETECTED Final   Bordetella pertussis NOT DETECTED NOT DETECTED Final   Bordetella Parapertussis NOT DETECTED NOT DETECTED Final   Chlamydophila pneumoniae NOT DETECTED NOT DETECTED Final   Mycoplasma pneumoniae NOT DETECTED NOT DETECTED Final    Comment: Performed at [facility], [place] [address] [place], [place] [address]  Culture, Urine     Status: Abnormal   Collection Time: 10/07/20  5:40 PM   Specimen: Urine, Random  Result Value Ref Range Status   Specimen Description URINE, RANDOM  Final   Special Requests   Final    NONE Performed at [facility], [place] [address], [place], [place] [address]    Culture MULTIPLE SPECIES PRESENT, SUGGEST RECOLLECTION (A)  Final   Report Status 10/08/2020 FINAL  Final       Today   Subjective:   Julia Henderson today has no headache,no chest or  abdominal pain, she denies any cough today, reports good night sleep.    Objective:   Blood pressure (!) 104/55, pulse 96, temperature 97.7 F (36.5 C), temperature source Axillary, resp. rate 13, weight 121.8 kg, SpO2 92 %.   Intake/Output Summary (Last 24 hours) at 10/11/2020 1147 Last data filed at 10/11/2020 1043 Gross per 24 hour  Intake 720 ml  Output -  Net 720 ml    Exam Awake Alert, Oriented x 3, No new F.N deficits, Normal affect Symmetrical Chest wall movement, Good air movement bilaterally, CTAB RRR,No Gallops,Rubs or new Murmurs, No Parasternal Heave +ve B.Sounds, Abd Soft, Non tender, No rebound -guarding or rigidity. No Cyanosis,  Clubbing or edema, No new Rash or bruise  Data Review   CBC w Diff:  Lab Results  Component Value Date   WBC 11.2 (H) 10/11/2020   HGB 12.5 10/11/2020   HCT 40.4 10/11/2020   PLT 237 10/11/2020   LYMPHOPCT 7 10/09/2020   MONOPCT 8 10/09/2020   EOSPCT 0 10/09/2020   BASOPCT 0 10/09/2020    CMP:  Lab Results  Component Value Date   NA 135 10/11/2020   K 4.4 10/11/2020   CL 97 (L) 10/11/2020   CO2 27 10/11/2020   BUN 57 (H) 10/11/2020   CREATININE 1.56 (H) 10/11/2020   PROT 7.7 10/07/2020   ALBUMIN 3.7 10/07/2020   BILITOT 0.6 10/07/2020   ALKPHOS 58 10/07/2020   AST 62 (H) 10/07/2020   ALT 43 10/07/2020  .   Total Time in preparing paper work, data evaluation and todays exam - 32 minutes  Phillips Climes M.D on 10/11/2020 at 11:47 AM  [facility]   Office  [phone]

## 2020-10-11 NOTE — Plan of Care (Signed)

## 2020-10-11 NOTE — Discharge Summary (Signed)
Julia Henderson, is a 60 y.o. female  DOB 1960-08-13  MRN [id number].  Admission date:  10/06/2020  Admitting Physician  Etta Quill, DO  Discharge Date:  10/11/2020   Primary MD  Patient, No Pcp Per  Recommendations for primary care physician for things to follow:  -Please check CBC, CMP during next visit. -Please adjust Lantus dose as needed -Consider lisinopril, once confirmed no recurrent hyperkalemia, and renal function is stable. -Patient will need outpatient sleep study to evaluate for OSA.  Admission Diagnosis  Shortness of breath [R06.02] Hyperglycemia [R73.9] Acute respiratory failure with hypoxia (HCC) [J96.01]   Discharge Diagnosis  Shortness of breath [R06.02] Hyperglycemia [R73.9] Acute respiratory failure with hypoxia ([place]) [J96.01]    Principal Problem:   Acute respiratory failure with hypoxia (HCC) Active Problems:   HTN (hypertension)   Morbid obesity (HCC)   DM2 (diabetes mellitus, type 2) ([place])      Past Medical History:  Diagnosis Date  . Bronchitis   . High cholesterol   . Hypertension     Past Surgical History:  Procedure Laterality Date  . CESAREAN SECTION         History of present illness and  Hospital Course:     Kindly see H&P for history of present illness and admission details, please review complete Labs, Consult reports and Test reports for all details in brief  HPI  from the history and physical done on the day of admission 10/06/2020   HPI: Julia Henderson is a 60 y.o. female with medical history significant of COPD / chronic bronchitis, HTN, HLD, morbid obesity.  Pt admitted to [facility] on 2/5 with COVID-19.  Since that time she has had SOB, wearing up to 4L PRN at home.  For the past 4 days she has had cough, chills, body aches, headache.  Cough productive of yellow sputum.  She has had SOB during that time that has progressively worsened, now  severe.  No abd pain, N/V/D, rash, syncope, orthopnea, leg swelling.  Does have new onset polyuria / polydipsia.  Not previously ever diagnosed with DM.   ED Course: Severe wheezing, improved some with neb treatments and steroids in ED.  BGL on BMP is 420.  (was in the 300s during ED visit last month too).  PT sating 100% on 4L, but remains wheezy and SOB.  D.Dimer 0.57.  Pt refused CT scan until daughter can be here in AM due to anxiety.   Hospital Course    acute respiratory failure with hypoxia/acute COPD exacerbation with bronchitis -Likely secondary to acute COPD exacerbation with bronchitis.   -D-dimers was elevated, so CTA chest was obtained, it is negative for PE or dissection, no infiltrate . -Patient with cough of productive sputum with associated wheezing with coughing. -She was treated with IV steroids, IV rocephin and azithromycin, no further need for antibiotics on discharge, she will be discharged on steroid taper.. -She will be discharged on 2.5 L nasal cannula, she is on oxygen on admission, at baseline 2 to 4  L.. . Hypertension -Blood pressure is acceptable, Aldactone has been stopped given hyperkalemia, as well lisinopril has been stopped given creatinine of 1.5, hyperkalemia, blood pressure is acceptable on metoprolol . -Resume lisinopril as an outpatient if creatinine and potassium level is acceptable.     Newly diagnosed diabetes mellitus type 2 -Patient states does not have a history of diabetes. -Hemoglobin A1c 10.3 (10/07/2020). -she will be discharged on  Lantus 25 units.  UTI -Urinalysis with moderate leukocytes, nitrite negative, WBC > 50 -treated with rocephin   Hyperkalemia -Resolved with Lokelma, avoid Aldactone.     Morbid obesity -Continue counseling about weight loss, TSH and free T4 within normal limit, I have recommended for her to follow as an outpatient for sleep study to rule out OSA, she was counseled about weight  loss.     Discharge Condition:  stable    [facility]. Schedule an appointment as soon as possible for a visit.   Specialty: Family Medicine Why: Please follow up with your primary care provider in the next week for a hospital follow up. Contact information: [place] [address] [phone]        [facility] Follow up.   Specialty: Rehabilitation Why: The outpatient therapy clinic will be calling you to set up outpatient PT/OT since home health services were unavailable. Contact information: [address] [id number] [place] [phone]               Discharge Instructions  and  Discharge Medications     Discharge Instructions    Amb Referral to Nutrition and Diabetic Education   Complete by: As directed    Ambulatory referral to Physical Therapy   Complete by: As directed    Discharge instructions   Complete by: As directed    Follow with Primary MD Patient,  in 7 days   Get CBC, CMP,  checked  by Primary MD next visit.    Activity: As tolerated with Full fall precautions use walker/cane & assistance as needed   Disposition Home    Diet: Heart Healthy /carb modified , with feeding assistance and aspiration precautions.  For Heart failure patients - Check your Weight same time everyday, if you gain over 2 pounds, or you develop in leg swelling, experience more shortness of breath or chest pain, call your Primary MD immediately. Follow Cardiac Low Salt Diet and 1.5 lit/day fluid restriction.   On your next visit with your primary care physician please Get Medicines reviewed and adjusted.   Please request your Prim.MD to go over all Hospital Tests and Procedure/Radiological results at the follow up, please get all Hospital records sent to your Prim MD by signing hospital release before you go home.   If you experience  worsening of your admission symptoms, develop shortness of breath, life threatening emergency, suicidal or homicidal thoughts you must seek medical attention immediately by calling 911 or calling your MD immediately  if symptoms less severe.  You Must read complete instructions/literature along with all the possible adverse reactions/side effects for all the Medicines you take and that have been prescribed to you. Take any new Medicines after you have completely understood and accpet all the possible adverse reactions/side effects.   Do not drive, operating heavy machinery, perform activities at heights, swimming or participation in water activities or provide baby sitting services if your were admitted for syncope or siezures until you have seen by  Primary MD or a Neurologist and advised to do so again.  Do not drive when taking Pain medications.    Do not take more than prescribed Pain, Sleep and Anxiety Medications  Special Instructions: If you have smoked or chewed Tobacco  in the last 2 yrs please stop smoking, stop any regular Alcohol  and or any Recreational drug use.  Wear Seat belts while driving.   Please note  You were cared for by a hospitalist during your hospital stay. If you have any questions about your discharge medications or the care you received while you were in the hospital after you are discharged, you can call the unit and asked to speak with the hospitalist on call if the hospitalist that took care of you is not available. Once you are discharged, your primary care physician will handle any further medical issues. Please note that NO REFILLS for any discharge medications will be authorized once you are discharged, as it is imperative that you return to your primary care physician (or establish a relationship with a primary care physician if you do not have one) for your aftercare needs so that they can reassess your need for medications and monitor your lab values.    Increase activity slowly   Complete by: As directed      Allergies as of 10/11/2020      Reactions   Celexa [citalopram]       Medication List    STOP taking these medications   amLODipine 5 MG tablet Commonly known as: NORVASC   lisinopril 20 MG tablet Commonly known as: ZESTRIL   spironolactone 25 MG tablet Commonly known as: ALDACTONE     TAKE these medications   albuterol 108 (90 Base) MCG/ACT inhaler Commonly known as: VENTOLIN HFA Inhale 2 puffs into the lungs every 4 (four) hours as needed for wheezing or shortness of breath.   aspirin EC 81 MG tablet Take 1 tablet (81 mg total) by mouth daily. Swallow whole.   benzonatate 100 MG capsule Commonly known as: TESSALON Take 1 capsule (100 mg total) by mouth every 8 (eight) hours as needed for cough.   blood glucose meter kit and supplies Kit Dispense based on patient and insurance preference. Use up to four times daily as directed.   Fish Oil 1000 MG Caps Take 1 capsule (1,000 mg total) by mouth daily.   gabapentin 300 MG capsule Commonly known as: NEURONTIN Take 3 capsules (900 mg total) by mouth 3 (three) times daily.   insulin glargine 100 UNIT/ML Solostar Pen Commonly known as: LANTUS Inject 25 Units into the skin daily.   Insulin Pen Needle 29G X 5MM Misc Use with insulin   metoprolol tartrate 100 MG tablet Commonly known as: LOPRESSOR Take 1 tablet (100 mg total) by mouth 2 (two) times daily.   montelukast 10 MG tablet Commonly known as: SINGULAIR Take 1 tablet (10 mg total) by mouth at bedtime.   multivitamin tablet Take 1 tablet by mouth daily.   pantoprazole 40 MG tablet Commonly known as: PROTONIX Take 1 tablet (40 mg total) by mouth daily at 6 (six) AM. Start taking on: October 12, 2020   pravastatin 40 MG tablet Commonly known as: PRAVACHOL Take 1 tablet (40 mg total) by mouth at bedtime.   predniSONE 10 MG (21) Tbpk tablet Commonly known as: STERAPRED UNI-PAK 21 TAB Use per package  instruction   traZODone 100 MG tablet Commonly known as: DESYREL Take 1 tablet (100 mg total) by mouth  at bedtime.   vitamin C 500 MG tablet Commonly known as: ASCORBIC ACID Take 500 mg by mouth daily.   Vitamin D3 125 MCG (5000 UT) Tabs Take 1 tablet (5,000 Units total) by mouth daily.   ZyrTEC Allergy 10 MG tablet Generic drug: cetirizine Take 10 mg by mouth daily.            Durable Medical Equipment  (From admission, onward)         Start     Ordered   10/09/20 1116  For home use only DME Other see comment  Once       Comments: Needs bariatric Rolling walker  Question:  Length of Need  Answer:  Lifetime   10/09/20 1117   10/09/20 1114  For home use only DME 3 n 1  Once       Comments: Needs bariatric Bedside commode   10/09/20 1114   10/08/20 0908  For home use only DME Walker rolling  Once       Comments: Patient needs Rollator  Question Answer Comment  Walker: With 5 Inch Wheels   Patient needs a walker to treat with the following condition Generalized weakness      10/08/20 0908            Diet and Activity recommendation: See Discharge Instructions above   Consults obtained -  None   Major procedures and Radiology Reports - PLEASE review detailed and final reports for all details, in brief -      DG Chest 2 View  Result Date: 10/06/2020 CLINICAL DATA:  60 year old female with shortness of breath. EXAM: CHEST - 2 VIEW COMPARISON:  Chest radiograph dated 09/13/2020. FINDINGS: No focal consolidation, pleural effusion or pneumothorax. Top-normal cardiac size. No acute osseous pathology. IMPRESSION: No active cardiopulmonary disease. Electronically Signed   By: Anner Crete M.D.   On: 10/06/2020 19:35   DG Chest 2 View  Result Date: 09/13/2020 CLINICAL DATA:  60 year old female with history of shortness of breath and productive cough for the past 2 weeks. EXAM: CHEST - 2 VIEW COMPARISON:  Chest x-ray 08/25/2020. FINDINGS: Lung volumes are  normal. No consolidative airspace disease. No pleural effusions. No pneumothorax. No pulmonary nodule or mass noted. Pulmonary vasculature and the cardiomediastinal silhouette are within normal limits. IMPRESSION: No radiographic evidence of acute cardiopulmonary disease. Electronically Signed   By: Vinnie Langton M.D.   On: 09/13/2020 15:01   CT ANGIO CHEST PE W OR WO CONTRAST  Result Date: 10/07/2020 CLINICAL DATA:  Shortness of breath EXAM: CT ANGIOGRAPHY CHEST WITH CONTRAST TECHNIQUE: Multidetector CT imaging of the chest was performed using the standard protocol during bolus administration of intravenous contrast. Multiplanar CT image reconstructions and MIPs were obtained to evaluate the vascular anatomy. CONTRAST:  92mL OMNIPAQUE IOHEXOL 350 MG/ML SOLN COMPARISON:  Chest radiograph October 06, 2020 FINDINGS: Cardiovascular: There is no demonstrable pulmonary embolus. There is no thoracic aortic aneurysm or dissection. There are occasional foci of calcification in visualized great vessels. There are occasional foci of coronary artery calcification. There is no pericardial effusion or pericardial thickening. Mediastinum/Nodes: Visualized thyroid appears unremarkable. No evident thoracic adenopathy. No esophageal lesions are appreciable. Lungs/Pleura: There are scattered foci of atelectatic change. No edema or airspace opacity present. No pleural effusions. No pneumothorax. Trachea and major bronchial structures appear unremarkable. Upper Abdomen: Visualized upper abdominal structures appear unremarkable. Musculoskeletal: No blastic or lytic bone lesions. No evident chest wall lesion. Review of the MIP images confirms the above findings.  IMPRESSION: 1. No demonstrable pulmonary embolus. No thoracic aortic aneurysm or dissection. There are scattered foci of coronary artery calcification. 2. Scattered areas of atelectasis. No edema or airspace opacity. No evident pleural effusions. 3.  No evident adenopathy.  Electronically Signed   By: Bretta Bang III M.D.   On: 10/07/2020 13:33    Micro Results     Recent Results (from the past 240 hour(s))  Respiratory (~20 pathogens) panel by PCR     Status: Abnormal   Collection Time: 10/07/20  3:14 AM   Specimen: Nasopharyngeal Swab; Respiratory  Result Value Ref Range Status   Adenovirus NOT DETECTED NOT DETECTED Final   Coronavirus 229E NOT DETECTED NOT DETECTED Final    Comment: (NOTE) The Coronavirus on the Respiratory Panel, DOES NOT test for the novel  Coronavirus (2019 nCoV)    Coronavirus HKU1 NOT DETECTED NOT DETECTED Final   Coronavirus NL63 NOT DETECTED NOT DETECTED Final   Coronavirus OC43 NOT DETECTED NOT DETECTED Final   Metapneumovirus NOT DETECTED NOT DETECTED Final   Rhinovirus / Enterovirus DETECTED (A) NOT DETECTED Final   Influenza A NOT DETECTED NOT DETECTED Final   Influenza B NOT DETECTED NOT DETECTED Final   Parainfluenza Virus 1 NOT DETECTED NOT DETECTED Final   Parainfluenza Virus 2 NOT DETECTED NOT DETECTED Final   Parainfluenza Virus 3 NOT DETECTED NOT DETECTED Final   Parainfluenza Virus 4 NOT DETECTED NOT DETECTED Final   Respiratory Syncytial Virus NOT DETECTED NOT DETECTED Final   Bordetella pertussis NOT DETECTED NOT DETECTED Final   Bordetella Parapertussis NOT DETECTED NOT DETECTED Final   Chlamydophila pneumoniae NOT DETECTED NOT DETECTED Final   Mycoplasma pneumoniae NOT DETECTED NOT DETECTED Final    Comment: Performed at [facility], [address] [place], [place] [address]  Culture, Urine     Status: Abnormal   Collection Time: 10/07/20  5:40 PM   Specimen: Urine, Random  Result Value Ref Range Status   Specimen Description URINE, RANDOM  Final   Special Requests   Final    NONE Performed at [facility], [address], [place], [place] [address]    Culture MULTIPLE SPECIES PRESENT, SUGGEST RECOLLECTION (A)  Final   Report Status 10/08/2020 FINAL  Final       Today    Subjective:   Julia Henderson today has no headache,no chest or  abdominal pain, she denies any cough today, reports good night sleep.    Objective:   Blood pressure (!) 104/55, pulse 96, temperature 97.7 F (36.5 C), temperature source Axillary, resp. rate 13, weight 121.8 kg, SpO2 92 %.   Intake/Output Summary (Last 24 hours) at 10/11/2020 1507 Last data filed at 10/11/2020 1043 Gross per 24 hour  Intake 480 ml  Output --  Net 480 ml    Exam Awake Alert, Oriented x 3, No new F.N deficits, Normal affect Symmetrical Chest wall movement, Good air movement bilaterally, CTAB RRR,No Gallops,Rubs or new Murmurs, No Parasternal Heave +ve B.Sounds, Abd Soft, Non tender, No rebound -guarding or rigidity. No Cyanosis, Clubbing or edema, No new Rash or bruise  Data Review   CBC w Diff:  Lab Results  Component Value Date   WBC 11.2 (H) 10/11/2020   HGB 12.5 10/11/2020   HCT 40.4 10/11/2020   PLT 237 10/11/2020   LYMPHOPCT 7 10/09/2020   MONOPCT 8 10/09/2020   EOSPCT 0 10/09/2020   BASOPCT 0 10/09/2020    CMP:  Lab Results  Component  Value Date   NA 135 10/11/2020   K 4.4 10/11/2020   CL 97 (L) 10/11/2020   CO2 27 10/11/2020   BUN 57 (H) 10/11/2020   CREATININE 1.56 (H) 10/11/2020   PROT 7.7 10/07/2020   ALBUMIN 3.7 10/07/2020   BILITOT 0.6 10/07/2020   ALKPHOS 58 10/07/2020   AST 62 (H) 10/07/2020   ALT 43 10/07/2020  .   Total Time in preparing paper work, data evaluation and todays exam - 82 minutes  Phillips Climes M.D on 10/11/2020 at 3:07 PM  [facility]   Office  [phone]

## 2020-10-11 NOTE — TOC Progression Note (Signed)
Transition of Care [facility]) - Progression Note    Patient Details  Name: Julia Henderson MRN: [id number] Date of Birth: 10/20/1960  [facility] CM/SW Contact  Janae Bridgeman, RN Phone Number: 10/11/2020, 1:09 PM  Clinical Narrative:    Case management spoke with the daughter on the phone regarding transitions of care to home.  The patient was admitted for SOB and respiratory failure with history of COVID infection.  The patient lives with the daughter at the home and the patient is on chronic home O2 at the house and does not have insurance coverage.  The patient's daughter states that the patient receives PCP follow up at the [facility] in [place], [place].  The patient's daughter does not have cash or a card so medications will need to be filled by the [facility] pharmacy and delivered to the room.  Loney Hering cash will be used for co-pay.  The patient's daughter understands that [facility] was unable to provide home health services so patient was sent a referral to outpatient Pt/OT.  The patient's daughter is aware.  The patient will be able to transport the patient home once her medications are delivered to the hospital from the [facility] pharmacy.  The patient received all dme from [employer] and will be transported home by car by the daughter.    Expected Discharge Plan: OP Rehab Barriers to Discharge: No Barriers Identified  Expected Discharge Plan and Services Expected Discharge Plan: OP Rehab In-house Referral: PCP / [facility] Discharge Planning Services: CM Consult,Follow-up appt [facility],Medication Assistance Post Acute Care Choice: Durable Medical Equipment,Home Health Living arrangements for the past 2 months: Single Family Home Expected Discharge Date: 10/11/20               DME Arranged: Dan Humphreys rolling with seat DME Agency: [employer] Date DME Agency Contacted: 10/08/20 Time DME Agency Contacted: 0900 Representative spoke with at DME Agency: [facility]  Arranged: PT HH Agency: [facility] Date [facility] Agency Contacted: 10/08/20 Time HH Agency Contacted: [phone] Representative spoke with at [facility] Agency: Lorenza Chick   Social Determinants of Health (SDOH) Interventions    Readmission Risk Interventions No flowsheet data found.

## 2020-11-30 ENCOUNTER — Inpatient Hospital Stay (HOSPITAL_COMMUNITY)
Admission: EM | Admit: 2020-11-30 | Discharge: 2020-12-04 | DRG: 190 | Disposition: A | Discharge status: Home / Self Care | Payer: Medicaid Other | Attending: Internal Medicine | Admitting: Internal Medicine

## 2020-11-30 ENCOUNTER — Emergency Department (HOSPITAL_COMMUNITY): Payer: Medicaid Other

## 2020-11-30 ENCOUNTER — Other Ambulatory Visit: Payer: Self-pay

## 2020-11-30 DIAGNOSIS — E119 Type 2 diabetes mellitus without complications: Secondary | ICD-10-CM | POA: Diagnosis present

## 2020-11-30 DIAGNOSIS — I5031 Acute diastolic (congestive) heart failure: Secondary | ICD-10-CM | POA: Diagnosis present

## 2020-11-30 DIAGNOSIS — I160 Hypertensive urgency: Secondary | ICD-10-CM

## 2020-11-30 DIAGNOSIS — Z7982 Long term (current) use of aspirin: Secondary | ICD-10-CM | POA: Diagnosis not present

## 2020-11-30 DIAGNOSIS — Z888 Allergy status to other drugs, medicaments and biological substances status: Secondary | ICD-10-CM

## 2020-11-30 DIAGNOSIS — F40240 Claustrophobia: Secondary | ICD-10-CM | POA: Diagnosis present

## 2020-11-30 DIAGNOSIS — Z8616 Personal history of COVID-19: Secondary | ICD-10-CM

## 2020-11-30 DIAGNOSIS — Z9981 Dependence on supplemental oxygen: Secondary | ICD-10-CM

## 2020-11-30 DIAGNOSIS — E7800 Pure hypercholesterolemia, unspecified: Secondary | ICD-10-CM | POA: Diagnosis present

## 2020-11-30 DIAGNOSIS — E785 Hyperlipidemia, unspecified: Secondary | ICD-10-CM

## 2020-11-30 DIAGNOSIS — J441 Chronic obstructive pulmonary disease with (acute) exacerbation: Secondary | ICD-10-CM

## 2020-11-30 DIAGNOSIS — Z6841 Body Mass Index (BMI) 40.0 and over, adult: Secondary | ICD-10-CM | POA: Diagnosis not present

## 2020-11-30 DIAGNOSIS — R062 Wheezing: Secondary | ICD-10-CM

## 2020-11-30 DIAGNOSIS — E875 Hyperkalemia: Secondary | ICD-10-CM | POA: Diagnosis present

## 2020-11-30 DIAGNOSIS — I110 Hypertensive heart disease with heart failure: Secondary | ICD-10-CM | POA: Diagnosis present

## 2020-11-30 DIAGNOSIS — J811 Chronic pulmonary edema: Secondary | ICD-10-CM

## 2020-11-30 DIAGNOSIS — R0602 Shortness of breath: Secondary | ICD-10-CM | POA: Diagnosis present

## 2020-11-30 DIAGNOSIS — G8929 Other chronic pain: Secondary | ICD-10-CM | POA: Diagnosis present

## 2020-11-30 DIAGNOSIS — Z794 Long term (current) use of insulin: Secondary | ICD-10-CM | POA: Diagnosis not present

## 2020-11-30 DIAGNOSIS — R0902 Hypoxemia: Secondary | ICD-10-CM

## 2020-11-30 DIAGNOSIS — Z20822 Contact with and (suspected) exposure to covid-19: Secondary | ICD-10-CM | POA: Diagnosis present

## 2020-11-30 DIAGNOSIS — J9620 Acute and chronic respiratory failure, unspecified whether with hypoxia or hypercapnia: Secondary | ICD-10-CM

## 2020-11-30 DIAGNOSIS — J9621 Acute and chronic respiratory failure with hypoxia: Secondary | ICD-10-CM | POA: Diagnosis present

## 2020-11-30 DIAGNOSIS — Z79899 Other long term (current) drug therapy: Secondary | ICD-10-CM

## 2020-11-30 HISTORY — DX: Hypertensive urgency: I16.0

## 2020-11-30 HISTORY — DX: Hyperlipidemia, unspecified: E78.5

## 2020-11-30 HISTORY — DX: Chronic obstructive pulmonary disease with (acute) exacerbation: J44.1

## 2020-11-30 HISTORY — DX: Acute and chronic respiratory failure, unspecified whether with hypoxia or hypercapnia: J96.20

## 2020-11-30 HISTORY — DX: Chronic pulmonary edema: J81.1

## 2020-11-30 LAB — URINALYSIS, ROUTINE W REFLEX MICROSCOPIC
Bilirubin Urine: NEGATIVE
Glucose, UA: NEGATIVE mg/dL
Hgb urine dipstick: NEGATIVE
Ketones, ur: 5 mg/dL — AB
Leukocytes,Ua: NEGATIVE
Nitrite: NEGATIVE
Protein, ur: 30 mg/dL — AB
Specific Gravity, Urine: 1.008 (ref 1.005–1.030)
pH: 6 (ref 5.0–8.0)

## 2020-11-30 LAB — HEPATIC FUNCTION PANEL
ALT: 29 U/L (ref 0–44)
AST: 47 U/L — ABNORMAL HIGH (ref 15–41)
Albumin: 3.4 g/dL — ABNORMAL LOW (ref 3.5–5.0)
Alkaline Phosphatase: 44 U/L (ref 38–126)
Bilirubin, Direct: 0.2 mg/dL (ref 0.0–0.2)
Indirect Bilirubin: 0.8 mg/dL (ref 0.3–0.9)
Total Bilirubin: 1 mg/dL (ref 0.3–1.2)
Total Protein: 7.4 g/dL (ref 6.5–8.1)

## 2020-11-30 LAB — TROPONIN I (HIGH SENSITIVITY)
Troponin I (High Sensitivity): 17 ng/L (ref <18)
Troponin I (High Sensitivity): 15 ng/L (ref <18)

## 2020-11-30 LAB — RESP PANEL BY RT-PCR (FLU A&B, COVID) ARPGX2
Influenza A by PCR: NEGATIVE
Influenza B by PCR: NEGATIVE
SARS Coronavirus 2 by RT PCR: NEGATIVE

## 2020-11-30 LAB — BASIC METABOLIC PANEL
Anion gap: 7 (ref 5–15)
BUN: 9 mg/dL (ref 8–23)
CO2: 39 mmol/L — ABNORMAL HIGH (ref 22–32)
Calcium: 8.7 mg/dL — ABNORMAL LOW (ref 8.9–10.3)
Chloride: 94 mmol/L — ABNORMAL LOW (ref 98–111)
Creatinine, Ser: 0.68 mg/dL (ref 0.44–1.00)
GFR, Estimated: >60 mL/min (ref >60)
Glucose, Bld: 113 mg/dL — ABNORMAL HIGH (ref 70–99)
Potassium: 3.6 mmol/L (ref 3.5–5.1)
Sodium: 140 mmol/L (ref 135–145)

## 2020-11-30 LAB — CBC
HCT: 40.8 % (ref 36.0–46.0)
Hemoglobin: 12.1 g/dL (ref 12.0–15.0)
MCH: 30 pg (ref 26.0–34.0)
MCHC: 29.7 g/dL — ABNORMAL LOW (ref 30.0–36.0)
MCV: 101 fL — ABNORMAL HIGH (ref 80.0–100.0)
Platelets: 180 10*3/uL (ref 150–400)
RBC: 4.04 MIL/uL (ref 3.87–5.11)
RDW: 15.3 % (ref 11.5–15.5)
WBC: 5.5 10*3/uL (ref 4.0–10.5)
nRBC: 0 % (ref 0.0–0.2)

## 2020-11-30 LAB — PROTIME-INR
INR: 1.1 (ref 0.8–1.2)
Prothrombin Time: 14.3 seconds (ref 11.4–15.2)

## 2020-11-30 LAB — TSH: TSH: 2.128 u[IU]/mL (ref 0.350–4.500)

## 2020-11-30 LAB — BRAIN NATRIURETIC PEPTIDE: B Natriuretic Peptide: 67.4 pg/mL (ref 0.0–100.0)

## 2020-11-30 LAB — GLUCOSE, CAPILLARY: Glucose-Capillary: 246 mg/dL — ABNORMAL HIGH (ref 70–99)

## 2020-11-30 MED ORDER — ENOXAPARIN SODIUM 40 MG/0.4ML IJ SOSY
40.0000 mg | PREFILLED_SYRINGE | INTRAMUSCULAR | Status: DC
  Administered 2020-11-30 – 2020-12-03 (×4): 40 mg via SUBCUTANEOUS
  Filled 2020-11-30 (×4): qty 0.4

## 2020-11-30 MED ORDER — ALBUTEROL SULFATE HFA 108 (90 BASE) MCG/ACT IN AERS
2.0000 | INHALATION_SPRAY | Freq: Once | RESPIRATORY_TRACT | Status: AC
  Administered 2020-11-30: 2 via RESPIRATORY_TRACT
  Filled 2020-11-30: qty 6.7

## 2020-11-30 MED ORDER — ACETAMINOPHEN 500 MG PO TABS
500.0000 mg | ORAL_TABLET | Freq: Once | ORAL | Status: AC
  Administered 2020-11-30: 500 mg via ORAL
  Filled 2020-11-30: qty 1

## 2020-11-30 MED ORDER — METHYLPREDNISOLONE SODIUM SUCC 125 MG IJ SOLR
60.0000 mg | Freq: Four times a day (QID) | INTRAMUSCULAR | Status: DC
  Administered 2020-12-01 – 2020-12-02 (×6): 60 mg via INTRAVENOUS
  Filled 2020-11-30 (×6): qty 2

## 2020-11-30 MED ORDER — INSULIN ASPART 100 UNIT/ML IJ SOLN
0.0000 [IU] | Freq: Every day | INTRAMUSCULAR | Status: DC
Start: 2020-11-30 — End: 2020-12-04
  Administered 2020-11-30: 2 [IU] via SUBCUTANEOUS
  Administered 2020-12-01 – 2020-12-02 (×2): 3 [IU] via SUBCUTANEOUS
  Administered 2020-12-03: 2 [IU] via SUBCUTANEOUS

## 2020-11-30 MED ORDER — ACETAMINOPHEN 325 MG PO TABS
650.0000 mg | ORAL_TABLET | Freq: Four times a day (QID) | ORAL | Status: DC | PRN
  Administered 2020-12-01 – 2020-12-03 (×6): 650 mg via ORAL
  Filled 2020-11-30 (×6): qty 2

## 2020-11-30 MED ORDER — FUROSEMIDE 10 MG/ML IJ SOLN
40.0000 mg | Freq: Once | INTRAMUSCULAR | Status: AC
  Administered 2020-11-30: 40 mg via INTRAVENOUS
  Filled 2020-11-30: qty 4

## 2020-11-30 MED ORDER — ALBUTEROL SULFATE (2.5 MG/3ML) 0.083% IN NEBU
2.5000 mg | INHALATION_SOLUTION | RESPIRATORY_TRACT | Status: DC | PRN
  Administered 2020-12-01: 2.5 mg via RESPIRATORY_TRACT
  Filled 2020-11-30: qty 3

## 2020-11-30 MED ORDER — GUAIFENESIN ER 600 MG PO TB12
1200.0000 mg | ORAL_TABLET | Freq: Two times a day (BID) | ORAL | Status: DC
  Administered 2020-11-30 – 2020-12-04 (×8): 1200 mg via ORAL
  Filled 2020-11-30 (×8): qty 2

## 2020-11-30 MED ORDER — ACETAMINOPHEN 650 MG RE SUPP: 650.0000 mg | Freq: Four times a day (QID) | RECTAL | Status: DC | PRN

## 2020-11-30 MED ORDER — INSULIN ASPART 100 UNIT/ML IJ SOLN
0.0000 [IU] | Freq: Three times a day (TID) | INTRAMUSCULAR | Status: DC
  Administered 2020-12-01: 3 [IU] via SUBCUTANEOUS
  Administered 2020-12-01: 8 [IU] via SUBCUTANEOUS
  Administered 2020-12-01: 11 [IU] via SUBCUTANEOUS
  Administered 2020-12-02: 9 [IU] via SUBCUTANEOUS
  Administered 2020-12-02: 8 [IU] via SUBCUTANEOUS
  Administered 2020-12-02: 5 [IU] via SUBCUTANEOUS
  Administered 2020-12-03: 3 [IU] via SUBCUTANEOUS
  Administered 2020-12-03: 8 [IU] via SUBCUTANEOUS
  Administered 2020-12-03: 2 [IU] via SUBCUTANEOUS
  Administered 2020-12-04: 5 [IU] via SUBCUTANEOUS
  Administered 2020-12-04: 3 [IU] via SUBCUTANEOUS

## 2020-11-30 MED ORDER — IPRATROPIUM-ALBUTEROL 0.5-2.5 (3) MG/3ML IN SOLN: 3.0000 mL | Freq: Once | RESPIRATORY_TRACT | Status: DC

## 2020-11-30 MED ORDER — HYDRALAZINE HCL 20 MG/ML IJ SOLN
5.0000 mg | INTRAMUSCULAR | Status: DC | PRN
  Administered 2020-12-02: 5 mg via INTRAVENOUS
  Filled 2020-11-30: qty 1

## 2020-11-30 MED ORDER — METHYLPREDNISOLONE SODIUM SUCC 125 MG IJ SOLR
125.0000 mg | Freq: Once | INTRAMUSCULAR | Status: AC
  Administered 2020-11-30: 125 mg via INTRAVENOUS
  Filled 2020-11-30: qty 2

## 2020-11-30 MED ORDER — GUAIFENESIN ER 600 MG PO TB12: 600.0000 mg | ORAL_TABLET | Freq: Two times a day (BID) | ORAL | Status: DC

## 2020-11-30 MED ORDER — BUDESONIDE 0.25 MG/2ML IN SUSP
0.2500 mg | Freq: Two times a day (BID) | RESPIRATORY_TRACT | Status: DC
  Administered 2020-12-01 – 2020-12-04 (×7): 0.25 mg via RESPIRATORY_TRACT
  Filled 2020-11-30 (×8): qty 2

## 2020-11-30 MED ORDER — IPRATROPIUM-ALBUTEROL 0.5-2.5 (3) MG/3ML IN SOLN
3.0000 mL | Freq: Four times a day (QID) | RESPIRATORY_TRACT | Status: DC
  Administered 2020-11-30 – 2020-12-02 (×9): 3 mL via RESPIRATORY_TRACT
  Filled 2020-11-30 (×9): qty 3

## 2020-11-30 NOTE — H&P (Signed)
History and Physical    Julia Henderson IEP:[id number] DOB: 21-Aug-1960 DOA: 11/30/2020  PCP: Pcp, No Patient coming from: Home  Chief Complaint: Shortness of breath  HPI: Julia Henderson is a 60 y.o. female with medical history significant of COPD/chronic bronchitis, chronic respiratory failure on 2 L home oxygen, hypertension, hyperlipidemia, insulin-dependent type 2 diabetes, morbid obesity presented to the ED for evaluation of shortness of breath and hypoxia.  Her oxygen saturation dropped to the 70s at home on 2 L supplemental oxygen.  In the ED, she was tachypneic and wheezing.  She required 6 L supplemental oxygen.  Blood pressure elevated with systolic as high as 660Y.  No fever.  Labs showing WBC 5.5, hemoglobin 12.1, platelet count 180K.  Sodium 140, potassium 3.6, chloride 94, bicarb 39, BUN 9, creatinine 0.6, glucose 113.  COVID and influenza PCR negative.  BNP within normal range.  High-sensitivity troponin negative x2.  EKG without acute ischemic changes.  Chest x-ray showing mild cardiomegaly with diffuse bilateral interstitial pulmonary opacity, likely edema.  No focal airspace opacity. She was given Solu-Medrol 125 mg and nebulizer treatment.  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  In February she had COVID and since then her breathing problem started.  She was subsequently placed on home oxygen.  Her PCP started her on Breztri inhaler a few weeks ago and she also uses albuterol inhaler as needed.  She does not have a nebulizer machine.  For the past 3 she has had increasing bilateral lower extremity edema and dyspnea.  She is wheezing and coughing up thick white sputum.  No fevers.  Her sats dropped to low 70s on 2 L home oxygen this morning.  Review of Systems:  All systems reviewed and apart from history of presenting illness, are negative.  Past Medical History:  Diagnosis Date  . Bronchitis   . High cholesterol   . Hypertension      Past Surgical History:  Procedure Laterality Date  . CESAREAN SECTION       reports that she has never smoked. She has never used smokeless tobacco. She reports current alcohol use. She reports previous drug use.  Allergies  Allergen Reactions  . Celexa [Citalopram]     Family History  Problem Relation Age of Onset  . Lung disease Neg Hx     Prior to Admission medications   Medication Sig Start Date End Date Taking? Authorizing Provider  albuterol (VENTOLIN HFA) 108 (90 Base) MCG/ACT inhaler Inhale 2 puffs into the lungs every 4 (four) hours as needed for wheezing or shortness of breath. 10/11/20   Elgergawy, Silver Huguenin, MD  albuterol (VENTOLIN HFA) 108 (90 Base) MCG/ACT inhaler INHALE 2 PUFFS INTO THE LUNGS EVERY 4 (FOUR) HOURS AS NEEDED FOR WHEEZING OR SHORTNESS OF BREATH. 10/11/20 10/11/21  Elgergawy, Silver Huguenin, MD  aspirin EC 81 MG tablet Take 1 tablet (81 mg total) by mouth daily. Swallow whole. 10/11/20   Elgergawy, Silver Huguenin, MD  benzonatate (TESSALON) 100 MG capsule Take 1 capsule (100 mg total) by mouth every 8 (eight) hours as needed for cough. 09/14/20   Loni Beckwith, PA-C  blood glucose meter kit and supplies KIT Dispense based on patient and insurance preference. Use up to four times daily as directed. 10/11/20   Elgergawy, Silver Huguenin, MD  Blood Glucose Monitoring Suppl (TRUE METRIX METER) w/Device KIT USE UP TO FOUR TIMES DAILY AS DIRECTED. 10/11/20 10/11/21  Elgergawy, Silver Huguenin, MD  cetirizine (ZYRTEC ALLERGY) 10 MG tablet Take 10 mg by mouth daily.    [provider]  Cholecalciferol (VITAMIN D3) 125 MCG (5000 UT) TABS Take 1 tablet (5,000 Units total) by mouth daily. 10/11/20   Elgergawy, Silver Huguenin, MD  gabapentin (NEURONTIN) 300 MG capsule Take 3 capsules (900 mg total) by mouth 3 (three) times daily. 10/11/20   Elgergawy, Silver Huguenin, MD  gabapentin (NEURONTIN) 300 MG capsule TAKE 3 CAPSULES (900 MG TOTAL) BY MOUTH THREE TIMES DAILY. 10/11/20 10/11/21  Elgergawy,  Silver Huguenin, MD  glucose blood test strip USE UP TO FOUR TIMES DAILY AS DIRECTED 10/11/20 10/11/21  Elgergawy, Silver Huguenin, MD  insulin glargine (LANTUS) 100 UNIT/ML Solostar Pen Inject 25 Units into the skin daily. 10/11/20   Elgergawy, Silver Huguenin, MD  insulin glargine (LANTUS) 100 UNIT/ML Solostar Pen INJECT 25 UNITS INTO THE SKIN DAILY. 10/11/20 10/11/21  Elgergawy, Silver Huguenin, MD  Insulin Pen Needle 29G X 5MM MISC Use with insulin 10/11/20   Elgergawy, Silver Huguenin, MD  Insulin Pen Needle 32G X 4 MM MISC USE WITH INSULIN 10/11/20 10/11/21  Elgergawy, Silver Huguenin, MD  metoprolol tartrate (LOPRESSOR) 100 MG tablet Take 1 tablet (100 mg total) by mouth 2 (two) times daily. 10/11/20   Elgergawy, Silver Huguenin, MD  metoprolol tartrate (LOPRESSOR) 100 MG tablet TAKE 1 TABLET (100 MG TOTAL) BY MOUTH TWO TIMES DAILY. 10/11/20 10/11/21  Elgergawy, Silver Huguenin, MD  montelukast (SINGULAIR) 10 MG tablet Take 1 tablet (10 mg total) by mouth at bedtime. 10/11/20   Elgergawy, Silver Huguenin, MD  montelukast (SINGULAIR) 10 MG tablet TAKE 1 TABLET (10 MG TOTAL) BY MOUTH AT BEDTIME. 10/11/20 10/11/21  Elgergawy, Silver Huguenin, MD  Multiple Vitamin (MULTIVITAMIN) tablet Take 1 tablet by mouth daily.    [provider]  omega-3 acid ethyl esters (LOVAZA) 1 g capsule TAKE 1 CAPSULE (1,000 MG TOTAL) BY MOUTH DAILY. 10/11/20 10/11/21  Elgergawy, Silver Huguenin, MD  Omega-3 Fatty Acids (FISH OIL) 1000 MG CAPS Take 1 capsule (1,000 mg total) by mouth daily. 10/11/20   Elgergawy, Silver Huguenin, MD  pantoprazole (PROTONIX) 40 MG tablet Take 1 tablet (40 mg total) by mouth daily at 6 (six) AM. 10/12/20   Elgergawy, Silver Huguenin, MD  pantoprazole (PROTONIX) 40 MG tablet TAKE 1 TABLET (40 MG TOTAL) BY MOUTH DAILY AT 6:00 AM. 10/11/20 10/11/21  Elgergawy, Silver Huguenin, MD  pravastatin (PRAVACHOL) 40 MG tablet Take 1 tablet (40 mg total) by mouth at bedtime. 10/11/20   Elgergawy, Silver Huguenin, MD  pravastatin (PRAVACHOL) 40 MG tablet TAKE 1 TABLET (40 MG TOTAL) BY MOUTH AT BEDTIME. 10/11/20 10/11/21   Elgergawy, Silver Huguenin, MD  predniSONE (STERAPRED UNI-PAK 21 TAB) 10 MG (21) TBPK tablet Use per package instruction 10/11/20   Elgergawy, Silver Huguenin, MD  predniSONE (STERAPRED UNI-PAK 21 TAB) 10 MG (21) TBPK tablet USE PER PACKAGE INSTRUCTION 10/11/20 10/11/21  Elgergawy, Silver Huguenin, MD  traZODone (DESYREL) 100 MG tablet Take 1 tablet (100 mg total) by mouth at bedtime. 10/11/20   Elgergawy, Silver Huguenin, MD  traZODone (DESYREL) 100 MG tablet TAKE 1 TABLET (100 MG TOTAL) BY MOUTH AT BEDTIME. 10/11/20 10/11/21  Elgergawy, Silver Huguenin, MD  TRUEplus Lancets 28G MISC USE UP TO FOUR TIMES DAILY AS DIRECTED 10/11/20 10/11/21  Elgergawy, Silver Huguenin, MD  vitamin C (ASCORBIC ACID) 500 MG tablet Take 500 mg by mouth daily.    [provider]    Physical Exam: Vitals:   11/30/20 1730 11/30/20 1915 11/30/20 1930  11/30/20 2045  BP: (!) 192/79 (!) 164/84  (!) 184/93  Pulse: 97 91 91 (!) 102  Resp: 18 (!) 22 (!) 21 19  Temp:   98.2 F (36.8 C)   TempSrc:   Oral   SpO2: 93% 92%  (!) 88%    Physical Exam Constitutional:      General: She is not in acute distress. HENT:     Head: Normocephalic and atraumatic.  Eyes:     Extraocular Movements: Extraocular movements intact.     Conjunctiva/sclera: Conjunctivae normal.  Cardiovascular:     Rate and Rhythm: Regular rhythm.     Pulses: Normal pulses.     Comments: Slightly tachycardic Pulmonary:     Breath sounds: Wheezing present.     Comments: Slightly dyspneic when speaking Abdominal:     General: Bowel sounds are normal. There is no distension.     Palpations: Abdomen is soft.     Tenderness: There is no abdominal tenderness.  Musculoskeletal:     Cervical back: Normal range of motion and neck supple.     Right lower leg: Edema present.     Left lower leg: Edema present.     Comments: +3 pitting edema of bilateral lower extremities  Skin:    General: Skin is warm and dry.  Neurological:     General: No focal deficit present.     Mental Status: She  is alert and oriented to person, place, and time.     Labs on Admission: I have personally reviewed following labs and imaging studies  CBC: Recent Labs  Lab 11/30/20 1152  WBC 5.5  HGB 12.1  HCT 40.8  MCV 101.0*  PLT 427   Basic Metabolic Panel: Recent Labs  Lab 11/30/20 1152  NA 140  K 3.6  CL 94*  CO2 39*  GLUCOSE 113*  BUN 9  CREATININE 0.68  CALCIUM 8.7*   GFR: CrCl cannot be calculated (Unknown ideal weight.). Liver Function Tests: Recent Labs  Lab 11/30/20 1305  AST 47*  ALT 29  ALKPHOS 44  BILITOT 1.0  PROT 7.4  ALBUMIN 3.4*   No results for input(s): LIPASE, AMYLASE in the last 168 hours. No results for input(s): AMMONIA in the last 168 hours. Coagulation Profile: Recent Labs  Lab 11/30/20 1305  INR 1.1   Cardiac Enzymes: No results for input(s): CKTOTAL, CKMB, CKMBINDEX, TROPONINI in the last 168 hours. BNP (last 3 results) No results for input(s): PROBNP in the last 8760 hours. HbA1C: No results for input(s): HGBA1C in the last 72 hours. CBG: No results for input(s): GLUCAP in the last 168 hours. Lipid Profile: No results for input(s): CHOL, HDL, LDLCALC, TRIG, CHOLHDL, LDLDIRECT in the last 72 hours. Thyroid Function Tests: Recent Labs    11/30/20 1306  TSH 2.128   Anemia Panel: No results for input(s): VITAMINB12, FOLATE, FERRITIN, TIBC, IRON, RETICCTPCT in the last 72 hours. Urine analysis:    Component Value Date/Time   COLORURINE YELLOW 11/30/2020 [place] 11/30/2020 1530   LABSPEC 1.008 11/30/2020 1530   PHURINE 6.0 11/30/2020 1530   GLUCOSEU NEGATIVE 11/30/2020 1530   HGBUR NEGATIVE 11/30/2020 [place] 11/30/2020 1530   KETONESUR 5 (A) 11/30/2020 1530   PROTEINUR 30 (A) 11/30/2020 1530   NITRITE NEGATIVE 11/30/2020 [place] 11/30/2020 1530    Radiological Exams on Admission: DG Chest 2 View  Result Date: 11/30/2020 CLINICAL DATA:  Shortness of breath, chest  pain EXAM:  CHEST - 2 VIEW COMPARISON:  10/06/2020 FINDINGS: Mild cardiomegaly. Diffuse bilateral interstitial pulmonary opacity. Disc degenerative disease of the thoracic spine IMPRESSION: Mild cardiomegaly with diffuse bilateral interstitial pulmonary opacity, likely edema. No focal airspace opacity. Electronically Signed   By: Eddie Candle M.D.   On: 11/30/2020 12:16    EKG: Independently reviewed.  Sinus rhythm, no acute ischemic changes.  Assessment/Plan Principal Problem:   COPD exacerbation (HCC) Active Problems:   Acute on chronic respiratory failure (HCC)   Pulmonary edema   Hypertensive urgency   Hyperlipidemia   Acute on chronic hypoxemic respiratory failure likely multifactorial from acute exacerbation of COPD/bronchitis and pulmonary edema Satting in the low 70s on 2 L home oxygen, placed on 6 L supplemental oxygen in the ED and still desatting into the 80s.  Currently requiring 8 L HFNC to maintain sats above 90%. Wheezing on exam and does appear volume overloaded.  BNP likely falsely low in the setting of morbid obesity.  Chest x-ray showing mild cardiomegaly with diffuse bilateral interstitial pulmonary opacity, likely edema. COVID and influenza PCR negative. -Solu-Medrol 60 mg every 6 hours, DuoNeb every 6 hours, albuterol nebulizer as needed, Pulmicort nebulizer twice daily, Mucinex, and flutter valve.  Will hold antibiotics given no focal airspace opacity on chest x-ray, no fever or leukocytosis.  Check procalcitonin level.  IV Lasix 40 mg x 1 and echocardiogram ordered.  ABG attempted but patient is a hard stick.  She has declined any further attempts.  Continuous pulse ox.  Continue supplemental oxygen.  May need BiPAP if work of breathing worsens although patient is hesitant wearing a mask as it makes her feel anxious and claustrophobic.  Hypertensive urgency Systolic as high as 191J in the ED. -IV hydralazine PRN.  Resume home medications after pharmacy med rec is  done.  Hyperlipidemia -Resume statin after pharmacy med rec is done.  Insulin-dependent type 2 diabetes Diagnosed during hospitalization in March 2022 with A1c 10.3. -She was started on basal insulin, resume after pharmacy med rec is done.  Sliding scale insulin moderate ACHS.   DVT prophylaxis: Lovenox Code Status: Full code Family Communication: No family available this time. Disposition Plan: Status is: Inpatient  Remains inpatient appropriate because:Inpatient level of care appropriate due to severity of illness   Dispo: The patient is from: Home              Anticipated d/c is to: Home              Patient currently is not medically stable to d/c.   Difficult to place patient No  Level of care: Level of care: Progressive   The medical decision making on this patient was of high complexity and the patient is at high risk for clinical deterioration, therefore this is a level 3 visit.  Shela Leff MD [facility]  If 7PM-7AM, please contact night-coverage [email or web address]  11/30/2020, 8:57 PM

## 2020-11-30 NOTE — ED Notes (Signed)
RT notified that pt is 88% on 6lpm via [place]. RT will be in to assess pt and place on high flow [place] and administer breathing treatment.

## 2020-11-30 NOTE — ED Notes (Signed)
Phlebotomist unable to obtain blood in triage.

## 2020-11-30 NOTE — ED Notes (Signed)
Pt given a [place] sandwich, cup of applesauce, PO fluids.

## 2020-11-30 NOTE — ED Notes (Signed)
Pt would like to stay in wheelchair. Pt states it is more comfortable.

## 2020-11-30 NOTE — ED Triage Notes (Signed)
Pt reports shob since having covid in February but has not been able to maintain good O2 sats on home O2 for the last two days, despite increasing to 5L from previously being on 2L at home.

## 2020-11-30 NOTE — ED Provider Notes (Signed)
[place] EMERGENCY DEPARTMENT Provider Note   CSN: [id number] Arrival date & time: 11/30/20  1141     History Chief Complaint  Patient presents with  . Shortness of Breath    Julia Henderson is a 60 y.o. female.  The history is provided by the patient and medical records. No language interpreter was used.  Shortness of Breath Severity:  Severe Onset quality:  Gradual Duration:  3 days Timing:  Constant Progression:  Worsening Chronicity:  New Context: URI   Relieved by:  Nothing Worsened by:  Exertion Ineffective treatments:  None tried Associated symptoms: cough, sputum production and wheezing   Associated symptoms: no abdominal pain, no chest pain, no diaphoresis, no fever, no headaches, no hemoptysis, no neck pain and no vomiting   Risk factors: obesity   Risk factors: no hx of PE/DVT        Past Medical History:  Diagnosis Date  . Bronchitis   . High cholesterol   . Hypertension     Patient Active Problem List   Diagnosis Date Noted  . Acute respiratory failure with hypoxia ([place]) 10/07/2020  . HTN (hypertension) 10/07/2020  . Morbid obesity ([place]) 10/07/2020  . DM2 (diabetes mellitus, type 2) ([place]) 10/07/2020    Past Surgical History:  Procedure Laterality Date  . CESAREAN SECTION       OB History   No obstetric history on file.     Family History  Problem Relation Age of Onset  . Lung disease Neg Hx     Social History   Tobacco Use  . Smoking status: Never Smoker  . Smokeless tobacco: Never Used  Substance Use Topics  . Alcohol use: Yes  . Drug use: Not Currently    Home Medications Prior to Admission medications   Medication Sig Start Date End Date Taking? Authorizing Provider  albuterol (VENTOLIN HFA) 108 (90 Base) MCG/ACT inhaler Inhale 2 puffs into the lungs every 4 (four) hours as needed for wheezing or shortness of breath. 10/11/20   Elgergawy, Silver Huguenin, MD  albuterol (VENTOLIN HFA) 108 (90 Base) MCG/ACT  inhaler INHALE 2 PUFFS INTO THE LUNGS EVERY 4 (FOUR) HOURS AS NEEDED FOR WHEEZING OR SHORTNESS OF BREATH. 10/11/20 10/11/21  Elgergawy, Silver Huguenin, MD  aspirin EC 81 MG tablet Take 1 tablet (81 mg total) by mouth daily. Swallow whole. 10/11/20   Elgergawy, Silver Huguenin, MD  benzonatate (TESSALON) 100 MG capsule Take 1 capsule (100 mg total) by mouth every 8 (eight) hours as needed for cough. 09/14/20   Loni Beckwith, PA-C  blood glucose meter kit and supplies KIT Dispense based on patient and insurance preference. Use up to four times daily as directed. 10/11/20   Elgergawy, Silver Huguenin, MD  Blood Glucose Monitoring Suppl (TRUE METRIX METER) w/Device KIT USE UP TO FOUR TIMES DAILY AS DIRECTED. 10/11/20 10/11/21  Elgergawy, Silver Huguenin, MD  cetirizine (ZYRTEC ALLERGY) 10 MG tablet Take 10 mg by mouth daily.    [provider]  Cholecalciferol (VITAMIN D3) 125 MCG (5000 UT) TABS Take 1 tablet (5,000 Units total) by mouth daily. 10/11/20   Elgergawy, Silver Huguenin, MD  gabapentin (NEURONTIN) 300 MG capsule Take 3 capsules (900 mg total) by mouth 3 (three) times daily. 10/11/20   Elgergawy, Silver Huguenin, MD  gabapentin (NEURONTIN) 300 MG capsule TAKE 3 CAPSULES (900 MG TOTAL) BY MOUTH THREE TIMES DAILY. 10/11/20 10/11/21  Elgergawy, Silver Huguenin, MD  glucose blood test strip USE UP TO FOUR TIMES DAILY AS DIRECTED  10/11/20 10/11/21  Elgergawy, Silver Huguenin, MD  insulin glargine (LANTUS) 100 UNIT/ML Solostar Pen Inject 25 Units into the skin daily. 10/11/20   Elgergawy, Silver Huguenin, MD  insulin glargine (LANTUS) 100 UNIT/ML Solostar Pen INJECT 25 UNITS INTO THE SKIN DAILY. 10/11/20 10/11/21  Elgergawy, Silver Huguenin, MD  Insulin Pen Needle 29G X 5MM MISC Use with insulin 10/11/20   Elgergawy, Silver Huguenin, MD  Insulin Pen Needle 32G X 4 MM MISC USE WITH INSULIN 10/11/20 10/11/21  Elgergawy, Silver Huguenin, MD  metoprolol tartrate (LOPRESSOR) 100 MG tablet Take 1 tablet (100 mg total) by mouth 2 (two) times daily. 10/11/20   Elgergawy, Silver Huguenin, MD   metoprolol tartrate (LOPRESSOR) 100 MG tablet TAKE 1 TABLET (100 MG TOTAL) BY MOUTH TWO TIMES DAILY. 10/11/20 10/11/21  Elgergawy, Silver Huguenin, MD  montelukast (SINGULAIR) 10 MG tablet Take 1 tablet (10 mg total) by mouth at bedtime. 10/11/20   Elgergawy, Silver Huguenin, MD  montelukast (SINGULAIR) 10 MG tablet TAKE 1 TABLET (10 MG TOTAL) BY MOUTH AT BEDTIME. 10/11/20 10/11/21  Elgergawy, Silver Huguenin, MD  Multiple Vitamin (MULTIVITAMIN) tablet Take 1 tablet by mouth daily.    [provider]  omega-3 acid ethyl esters (LOVAZA) 1 g capsule TAKE 1 CAPSULE (1,000 MG TOTAL) BY MOUTH DAILY. 10/11/20 10/11/21  Elgergawy, Silver Huguenin, MD  Omega-3 Fatty Acids (FISH OIL) 1000 MG CAPS Take 1 capsule (1,000 mg total) by mouth daily. 10/11/20   Elgergawy, Silver Huguenin, MD  pantoprazole (PROTONIX) 40 MG tablet Take 1 tablet (40 mg total) by mouth daily at 6 (six) AM. 10/12/20   Elgergawy, Silver Huguenin, MD  pantoprazole (PROTONIX) 40 MG tablet TAKE 1 TABLET (40 MG TOTAL) BY MOUTH DAILY AT 6:00 AM. 10/11/20 10/11/21  Elgergawy, Silver Huguenin, MD  pravastatin (PRAVACHOL) 40 MG tablet Take 1 tablet (40 mg total) by mouth at bedtime. 10/11/20   Elgergawy, Silver Huguenin, MD  pravastatin (PRAVACHOL) 40 MG tablet TAKE 1 TABLET (40 MG TOTAL) BY MOUTH AT BEDTIME. 10/11/20 10/11/21  Elgergawy, Silver Huguenin, MD  predniSONE (STERAPRED UNI-PAK 21 TAB) 10 MG (21) TBPK tablet Use per package instruction 10/11/20   Elgergawy, Silver Huguenin, MD  predniSONE (STERAPRED UNI-PAK 21 TAB) 10 MG (21) TBPK tablet USE PER PACKAGE INSTRUCTION 10/11/20 10/11/21  Elgergawy, Silver Huguenin, MD  traZODone (DESYREL) 100 MG tablet Take 1 tablet (100 mg total) by mouth at bedtime. 10/11/20   Elgergawy, Silver Huguenin, MD  traZODone (DESYREL) 100 MG tablet TAKE 1 TABLET (100 MG TOTAL) BY MOUTH AT BEDTIME. 10/11/20 10/11/21  Elgergawy, Silver Huguenin, MD  TRUEplus Lancets 28G MISC USE UP TO FOUR TIMES DAILY AS DIRECTED 10/11/20 10/11/21  Elgergawy, Silver Huguenin, MD  vitamin C (ASCORBIC ACID) 500 MG tablet Take 500 mg by  mouth daily.    [provider]    Allergies    Celexa [citalopram]  Review of Systems   Review of Systems  Constitutional: Positive for chills and fatigue. Negative for diaphoresis and fever.  HENT: Negative for congestion.   Eyes: Negative for visual disturbance.  Respiratory: Positive for cough, sputum production, chest tightness, shortness of breath and wheezing. Negative for hemoptysis and stridor.   Cardiovascular: Positive for leg swelling. Negative for chest pain and palpitations.  Gastrointestinal: Negative for abdominal pain, constipation, diarrhea, nausea and vomiting.  Genitourinary: Negative for dysuria.  Musculoskeletal: Negative for back pain and neck pain.  Neurological: Negative for light-headedness and headaches.  Psychiatric/Behavioral: Negative for agitation.  All other systems reviewed and are negative.  Physical Exam Updated Vital Signs BP (!) 167/80   Pulse 98   Temp 98.9 F (37.2 C)   Resp (!) 30   SpO2 94%   Physical Exam Vitals and nursing note reviewed.  Constitutional:      General: She is not in acute distress.    Appearance: She is well-developed. She is not ill-appearing, toxic-appearing or diaphoretic.  HENT:     Head: Normocephalic and atraumatic.  Eyes:     Conjunctiva/sclera: Conjunctivae normal.     Pupils: Pupils are equal, round, and reactive to light.  Cardiovascular:     Rate and Rhythm: Normal rate and regular rhythm.     Heart sounds: No murmur heard.   Pulmonary:     Effort: Pulmonary effort is normal. Tachypnea present. No respiratory distress.     Breath sounds: Wheezing and rales present. No rhonchi.  Chest:     Chest wall: No tenderness.  Abdominal:     Palpations: Abdomen is soft.     Tenderness: There is no abdominal tenderness.  Musculoskeletal:     Cervical back: Neck supple.     Right lower leg: Edema present.     Left lower leg: Edema present.  Skin:    General: Skin is warm and dry.      Capillary Refill: Capillary refill takes less than 2 seconds.     Findings: No erythema.  Neurological:     General: No focal deficit present.     Mental Status: She is alert.  Psychiatric:        Mood and Affect: Mood normal.     ED Results / Procedures / Treatments   Labs (all labs ordered are listed, but only abnormal results are displayed) Labs Reviewed  CBC - Abnormal; Notable for the following components:      Result Value   MCV 101.0 (*)    MCHC 29.7 (*)    All other components within normal limits  BASIC METABOLIC PANEL - Abnormal; Notable for the following components:   Chloride 94 (*)    CO2 39 (*)    Glucose, Bld 113 (*)    Calcium 8.7 (*)    All other components within normal limits  HEPATIC FUNCTION PANEL - Abnormal; Notable for the following components:   Albumin 3.4 (*)    AST 47 (*)    All other components within normal limits  URINALYSIS, ROUTINE W REFLEX MICROSCOPIC - Abnormal; Notable for the following components:   Ketones, ur 5 (*)    Protein, ur 30 (*)    Bacteria, UA RARE (*)    All other components within normal limits  RESP PANEL BY RT-PCR (FLU A&B, COVID) [id number]  URINE CULTURE  BRAIN NATRIURETIC PEPTIDE  PROTIME-INR  TSH  PROCALCITONIN  TROPONIN I (HIGH SENSITIVITY)  TROPONIN I (HIGH SENSITIVITY)    EKG EKG Interpretation  Date/Time:  Saturday Nov 30 2020 11:54:36 EDT Ventricular Rate:  96 PR Interval:  184 QRS Duration: 76 QT Interval:  346 QTC Calculation: 437 R Axis:   89 Text Interpretation: Normal sinus rhythm Low voltage QRS Cannot rule out Anterior infarct , age undetermined Abnormal ECG when compared to prior, similar appearance with slower rate. No STEMI Confirmed by Antony Blackbird [id number]) on 11/30/2020 12:11:43 PM   Radiology DG Chest 2 View  Result Date: 11/30/2020 CLINICAL DATA:  Shortness of breath, chest pain EXAM: CHEST - 2 VIEW COMPARISON:  10/06/2020 FINDINGS: Mild cardiomegaly. Diffuse bilateral interstitial  pulmonary opacity. Disc degenerative disease of  the thoracic spine IMPRESSION: Mild cardiomegaly with diffuse bilateral interstitial pulmonary opacity, likely edema. No focal airspace opacity. Electronically Signed   By: Eddie Candle M.D.   On: 11/30/2020 12:16    Procedures Procedures   Medications Ordered in ED Medications  methylPREDNISolone sodium succinate (SOLU-MEDROL) 125 mg/2 mL injection 60 mg (has no administration in time range)  enoxaparin (LOVENOX) injection 40 mg (has no administration in time range)  acetaminophen (TYLENOL) tablet 650 mg (has no administration in time range)    Or  acetaminophen (TYLENOL) suppository 650 mg (has no administration in time range)  furosemide (LASIX) injection 40 mg (has no administration in time range)  ipratropium-albuterol (DUONEB) 0.5-2.5 (3) MG/3ML nebulizer solution 3 mL (3 mLs Nebulization Given 11/30/20 2102)  albuterol (PROVENTIL) (2.5 MG/3ML) 0.083% nebulizer solution 2.5 mg (has no administration in time range)  budesonide (PULMICORT) nebulizer solution 0.25 mg (0.25 mg Nebulization Not Given 11/30/20 2105)  insulin aspart (novoLOG) injection 0-15 Units (has no administration in time range)  insulin aspart (novoLOG) injection 0-5 Units (has no administration in time range)  hydrALAZINE (APRESOLINE) injection 5 mg (has no administration in time range)  guaiFENesin (MUCINEX) 12 hr tablet 1,200 mg (has no administration in time range)  methylPREDNISolone sodium succinate (SOLU-MEDROL) 125 mg/2 mL injection 125 mg (125 mg Intravenous Given 11/30/20 1337)  albuterol (VENTOLIN HFA) 108 (90 Base) MCG/ACT inhaler 2 puff (2 puffs Inhalation Given 11/30/20 1335)  acetaminophen (TYLENOL) tablet 500 mg (500 mg Oral Given 11/30/20 1738)    ED Course  I have reviewed the triage vital signs and the nursing notes.  Pertinent labs & imaging results that were available during my care of the patient were reviewed by me and considered in my medical  decision making (see chart for details).    MDM Rules/Calculators/A&P                          Julia Henderson is a 60 y.o. female with a past medical history significant for diabetes, hypertension, morbid obesity, hypercholesterolemia, and acute respiratory failure with chronic hypoxia on 2 L at home who presents with 3 days of worsening respiratory difficulties, bilateral lower extremity edema, fatigue, and cough.  She reports that she has not had any hemoptysis and has had a phlegm-like sputum for the last few days.  She denies any fevers as report occasional chills.  She says that she had COVID several months ago and has had some respiratory troubles ever since but it has acutely worsened.  She reports she has not had much leg swelling in the past but has bilateral leg edema that is severe in the last few days.  She denies any unilateral symptoms and denies history of DVT or PE.  She denies any chest pain whatsoever.  She denies any palpitations or syncope.  She denies any nausea, vomiting, constipation, or diarrhea.  Denies any urinary changes.  On exam, lungs have wheezing in all lung fields as well as some rales in the bases.  Chest and abdomen are nontender.  Back is nontender.  Patient has significant IMA in both legs which she reports is new.  Exam otherwise unremarkable.  She is tachypneic on evaluation and had oxygen saturations dipping into the upper 80s even on 5 L nasal cannula.  EKG does not show STEMI.  Had a shared decision made conversation with patient at work-up.  Given her lack of unilateral leg symptoms and her lack of chest pain or  tachycardia, patient agrees to not do further work-up to rule out DVT or PE at this time.  We will however work-up to look for concern of new heart failure with edema and the significant shortness of breath and reported hypoxia.  Patient was still having oxygen saturations dipping into the 80s despite being on 5 L, will continue to monitor.  We will get  chest x-ray, labs, and a BNP.  We will also get urinalysis to look for significant protein or other urinary changes.  COVID swab will be sent to look for current COVID or influenza which is been going around the community.  Patient's chest x-ray returned showing evidence of pulmonary edema but no acute opacity or pneumonia.  Anticipate admission for likely new heart failure causing worsening respiratory distress and worsening edema.  COVID and flu test negative.  BNP not elevated.  Troponin negative x2.  Chest x-ray shows likely edema with opacities.  Given her lack of fever, normal white blood cell count, and normal lactic acid, lower suspicion for pneumonia at this time.  I suspect she has a exacerbation of wheezing and reactive airway disease.  Family reports that oxygen saturation was in the 70s at home on her home oxygen so we do feel she needs admission.  Oxygen saturation dipped into the 80s on 5 L, increased to 6 L nasal cannula.  Patient will be admitted for further management.  We will give a DuoNeb now that she is COVID and flu negative.  Will call for admission.  Of note, during her ED stay, she had a tourniquet on her right arm just proximal to her elbow from an IV stick.  It was left in place for a prolonged period of time.  When it was discovered, it was quickly removed and on my assessment she has intact pulses.  No report of neurological deficits in the arm.  This episode was reportedly filed as a safety zone for further evaluation.  .  Patient agrees with admission for the hypoxia and respiratory troubles.  Patient admitted for further management.   Final Clinical Impression(s) / ED Diagnoses Final diagnoses:  Hypoxia  Shortness of breath  Wheezing    Clinical Impression: 1. Hypoxia   2. Shortness of breath   3. Wheezing     Disposition: Admit  This note was prepared with assistance of Dragon voice recognition software. Occasional wrong-word or sound-a-like  substitutions may have occurred due to the inherent limitations of voice recognition software.     Rosaelena Kemnitz, Gwenyth Allegra, MD 11/30/20 2146

## 2020-11-30 NOTE — ED Notes (Signed)
Redness noted to right upper extremity from a previous tourniquet. No tourniquet present at this time. Pulse is strong and cap refill < 3 sec.

## 2020-11-30 NOTE — Progress Notes (Signed)
RT attempted to obtain ABG on patient. Patient wanted RT to stop attempt due to no blood yet/patient in pain. Per MD okay to hold off on ABG at this time.

## 2020-11-30 NOTE — ED Notes (Signed)
Attempted to call report x 1  

## 2020-12-01 ENCOUNTER — Inpatient Hospital Stay (HOSPITAL_COMMUNITY): Payer: Medicaid Other

## 2020-12-01 DIAGNOSIS — I160 Hypertensive urgency: Secondary | ICD-10-CM

## 2020-12-01 DIAGNOSIS — J810 Acute pulmonary edema: Secondary | ICD-10-CM

## 2020-12-01 DIAGNOSIS — E785 Hyperlipidemia, unspecified: Secondary | ICD-10-CM

## 2020-12-01 DIAGNOSIS — J9621 Acute and chronic respiratory failure with hypoxia: Secondary | ICD-10-CM

## 2020-12-01 LAB — GLUCOSE, CAPILLARY
Glucose-Capillary: 268 mg/dL — ABNORMAL HIGH (ref 70–99)
Glucose-Capillary: 292 mg/dL — ABNORMAL HIGH (ref 70–99)
Glucose-Capillary: 335 mg/dL — ABNORMAL HIGH (ref 70–99)
Glucose-Capillary: 162 mg/dL — ABNORMAL HIGH (ref 70–99)
Glucose-Capillary: 277 mg/dL — ABNORMAL HIGH (ref 70–99)

## 2020-12-01 LAB — PROCALCITONIN: Procalcitonin: <0.1 ng/mL

## 2020-12-01 LAB — URINE CULTURE: Culture: <10000 — AB

## 2020-12-01 LAB — ECHOCARDIOGRAM COMPLETE

## 2020-12-01 MED ORDER — METOPROLOL TARTRATE 100 MG PO TABS
100.0000 mg | ORAL_TABLET | Freq: Two times a day (BID) | ORAL | Status: DC
  Administered 2020-12-01 – 2020-12-04 (×7): 100 mg via ORAL
  Filled 2020-12-01 (×7): qty 1

## 2020-12-01 MED ORDER — INSULIN ASPART 100 UNIT/ML IJ SOLN
3.0000 [IU] | Freq: Three times a day (TID) | INTRAMUSCULAR | Status: DC
  Administered 2020-12-01 – 2020-12-02 (×3): 3 [IU] via SUBCUTANEOUS

## 2020-12-01 MED ORDER — FUROSEMIDE 10 MG/ML IJ SOLN
60.0000 mg | Freq: Once | INTRAMUSCULAR | Status: AC
  Administered 2020-12-01: 60 mg via INTRAVENOUS
  Filled 2020-12-01: qty 6

## 2020-12-01 MED ORDER — AZITHROMYCIN 250 MG PO TABS
500.0000 mg | ORAL_TABLET | Freq: Every day | ORAL | Status: DC
  Administered 2020-12-01 – 2020-12-04 (×4): 500 mg via ORAL
  Filled 2020-12-01 (×5): qty 2

## 2020-12-01 MED ORDER — BENZONATATE 100 MG PO CAPS
100.0000 mg | ORAL_CAPSULE | Freq: Three times a day (TID) | ORAL | Status: DC | PRN
  Administered 2020-12-02: 100 mg via ORAL
  Filled 2020-12-01: qty 1

## 2020-12-01 MED ORDER — MONTELUKAST SODIUM 10 MG PO TABS
10.0000 mg | ORAL_TABLET | Freq: Every day | ORAL | Status: DC
  Administered 2020-12-01 – 2020-12-03 (×3): 10 mg via ORAL
  Filled 2020-12-01 (×3): qty 1

## 2020-12-01 MED ORDER — GABAPENTIN 300 MG PO CAPS
900.0000 mg | ORAL_CAPSULE | Freq: Three times a day (TID) | ORAL | Status: DC
  Administered 2020-12-01 – 2020-12-04 (×10): 900 mg via ORAL
  Filled 2020-12-01 (×10): qty 3

## 2020-12-01 MED ORDER — HYDROCOD POLST-CPM POLST ER 10-8 MG/5ML PO SUER
5.0000 mL | Freq: Two times a day (BID) | ORAL | Status: DC | PRN
  Administered 2020-12-02: 5 mL via ORAL
  Filled 2020-12-01: qty 5

## 2020-12-01 MED ORDER — INSULIN GLARGINE 100 UNIT/ML ~~LOC~~ SOLN
10.0000 [IU] | Freq: Every day | SUBCUTANEOUS | Status: DC
  Administered 2020-12-01 – 2020-12-02 (×2): 10 [IU] via SUBCUTANEOUS
  Filled 2020-12-01 (×2): qty 0.1

## 2020-12-01 NOTE — Progress Notes (Signed)
PROGRESS NOTE  Julia Henderson  WGN:[id number] DOB: [birth year] DOA: 11/30/2020 PCP: Pcp, No  Brief Narrative: Julia Henderson is a 60 y.o. female with a history of COPD, 2L -O2-dependent respiratory failure since covid-19 infection earlier this year, HTN, HLD, IDT2DM, and morbid obesity who presented to the ED with shortness of breath and worsening hypoxia found to be tachypneic, wheezing with cardiomegaly, bilateral interstitial opacities suspicious for edema without focal infiltrate on CXR. Troponin negative x2, WBC wnl, PCT negative, BNP <100, covid and influenza PCR negative. Lasix, steroids, and nebulized treatments were given and patient was admitted.  Assessment & Plan: Principal Problem:   COPD exacerbation (HCC) Active Problems:   Acute on chronic respiratory failure (HCC)   Pulmonary edema   Hypertensive urgency   Hyperlipidemia  Acute on chronic hypoxic respiratory failure: Likely multifactorial including primarily COPD exacerbation, as well as pulmonary edema on suspected OSA/OHS, postinflammatory respiratory failure following covid-19 infection.  - Continue supplemental oxygen as needed, remain in PCU.  - Continue diuresis as below - Continue steroids for COPD exacerbation, decrease dose as soon as wheezing resolved. Continue scheduled and prn nebs, home singulair. Pt having increased sputum production. No focal infiltrate on CXR. Will start azithromycin x5 days. QTc .   HTN:  - Restart home metoprolol (confirmed dose) 100mg  po BID.   IDT2DM: New Dx at time of last hospitalization in wake of covid-19. HbA1c was 10.3% 3/21 (about 6 weeks ago). - Restart lower dose of insulin with meal coverage and SSI, titrate as needed.   Chronic pain:  - Restart home gabapentin, asked RN to administer ASAP. Hasn't had this for >24 hours, so suspect there is some withdrawal as this is long term medication at high dose (900mg  TID x10 years).   Acute pulmonary edema: Likely due to acute  HFpEF given elevated BP, obesity.  - Repeat lasix 60mg  IV today.  - Monitor I/O, weights, BMP, Mg  Morbid obesity: Estimated body mass index is 50.73 kg/m as calculated from the following:   Height as of 09/13/20: 5\' 1"  (1.549 m).   Weight as of 10/07/20: 121.8 kg.  DVT prophylaxis: Lovenox 40mg  currently. Will order 0.5mg /kg once weight is measured. Code Status: Full Family Communication: Daughter by speaker phone throughout encounter Disposition Plan:  Status is: Inpatient  Remains inpatient appropriate because:Ongoing diagnostic testing needed not appropriate for outpatient work up and Inpatient level of care appropriate due to severity of illness  Dispo: The patient is from: Home              Anticipated d/c is to: TBD              Patient currently is not medically stable to d/c.   Difficult to place patient No  Consultants:   None  Procedures:   Echocardiogram ordered  Antimicrobials:  Azithromycin 5/15 - 5/19   Subjective: Feels very uncomfortable, jittery, still very short of breath and wheezing though wheezing improves with breathing treatments. No chest pain reported. Leg swelling is noted and minimally changes from yesterday. Irritated from urinating all night after lasix was given at 11pm per pt.   Objective: Vitals:   12/01/20 0445 12/01/20 0558 12/01/20 0746 12/01/20 0831  BP:   (!) 166/88   Pulse:   (!) 120   Resp: 20  18   Temp:   98 F (36.7 C)   TempSrc:   Oral   SpO2:  94% 94% 98%    Intake/Output Summary (Last 24 hours) at 12/01/2020 1202  Last data filed at 12/01/2020 0130 Gross per 24 hour  Intake --  Output 725 ml  Net -725 ml   There were no vitals filed for this visit.  Gen: 60 y.o. female  in mild distress Pulm: Non-labored tachypnea, upper airway wheezing which subsides with normalized effort. Fair air movement with scant end-expiratory lower wheezing, no crackles.   CV: Regular rate and rhythm. No murmur, rub, or gallop. UTD JVD, 1+  pitting LE edema. GI: Abdomen soft, non-tender, non-distended, with normoactive bowel sounds. No organomegaly or masses felt. Ext: Warm, no deformities Skin: No rashes, lesions or ulcers Neuro: Alert and oriented. No focal neurological deficits. Psych: Judgement and insight appear normal. Mood & affect appropriate.   Data Reviewed: I have personally reviewed following labs and imaging studies  CBC: Recent Labs  Lab 11/30/20 1152  WBC 5.5  HGB 12.1  HCT 40.8  MCV 101.0*  PLT 180   Basic Metabolic Panel: Recent Labs  Lab 11/30/20 1152  NA 140  K 3.6  CL 94*  CO2 39*  GLUCOSE 113*  BUN 9  CREATININE 0.68  CALCIUM 8.7*   GFR: CrCl cannot be calculated (Unknown ideal weight.). Liver Function Tests: Recent Labs  Lab 11/30/20 1305  AST 47*  ALT 29  ALKPHOS 44  BILITOT 1.0  PROT 7.4  ALBUMIN 3.4*   No results for input(s): LIPASE, AMYLASE in the last 168 hours. No results for input(s): AMMONIA in the last 168 hours. Coagulation Profile: Recent Labs  Lab 11/30/20 1305  INR 1.1   Cardiac Enzymes: No results for input(s): CKTOTAL, CKMB, CKMBINDEX, TROPONINI in the last 168 hours. BNP (last 3 results) No results for input(s): PROBNP in the last 8760 hours. HbA1C: No results for input(s): HGBA1C in the last 72 hours. CBG: Recent Labs  Lab 11/30/20 2241 12/01/20 0746 12/01/20 0920  GLUCAP 246* 268* 292*   Lipid Profile: No results for input(s): CHOL, HDL, LDLCALC, TRIG, CHOLHDL, LDLDIRECT in the last 72 hours. Thyroid Function Tests: Recent Labs    11/30/20 1306  TSH 2.128   Anemia Panel: No results for input(s): VITAMINB12, FOLATE, FERRITIN, TIBC, IRON, RETICCTPCT in the last 72 hours. Urine analysis:    Component Value Date/Time   COLORURINE YELLOW 11/30/2020 1530   APPEARANCEUR CLEAR 11/30/2020 1530   LABSPEC 1.008 11/30/2020 1530   PHURINE 6.0 11/30/2020 1530   GLUCOSEU NEGATIVE 11/30/2020 1530   HGBUR NEGATIVE 11/30/2020 1530   BILIRUBINUR  NEGATIVE 11/30/2020 1530   KETONESUR 5 (A) 11/30/2020 1530   PROTEINUR 30 (A) 11/30/2020 1530   NITRITE NEGATIVE 11/30/2020 1530   LEUKOCYTESUR NEGATIVE 11/30/2020 1530   Recent Results (from the past 240 hour(s))  Resp Panel by RT-PCR (Flu A&B, Covid) Nasopharyngeal Swab     Status: None   Collection Time: 11/30/20 12:36 PM   Specimen: Nasopharyngeal Swab; Nasopharyngeal(NP) swabs in vial transport medium  Result Value Ref Range Status   SARS Coronavirus 2 by RT PCR NEGATIVE NEGATIVE Final    Comment: (NOTE) SARS-CoV-2 target nucleic acids are NOT DETECTED.  The SARS-CoV-2 RNA is generally detectable in upper respiratory specimens during the acute phase of infection. The lowest concentration of SARS-CoV-2 viral copies this assay can detect is 138 copies/mL. A negative result does not preclude SARS-Cov-2 infection and should not be used as the sole basis for treatment or other patient management decisions. A negative result may occur with  improper specimen collection/handling, submission of specimen other than nasopharyngeal swab, presence of viral mutation(s) within the  areas targeted by this assay, and inadequate number of viral copies(<138 copies/mL). A negative result must be combined with clinical observations, patient history, and epidemiological information. The expected result is Negative.  Fact Sheet for Patients:  BloggerCourse.com  Fact Sheet for Healthcare Providers:  SeriousBroker.it  This test is no t yet approved or cleared by the [place] FDA and  has been authorized for detection and/or diagnosis of SARS-CoV-2 by FDA under an Emergency Use Authorization (EUA). This EUA will remain  in effect (meaning this test can be used) for the duration of the COVID-19 declaration under Section 564(b)(1) of the Act, 21 U.S.C.section 360bbb-3(b)(1), unless the authorization is terminated  or revoked sooner.        Influenza A by PCR NEGATIVE NEGATIVE Final   Influenza B by PCR NEGATIVE NEGATIVE Final    Comment: (NOTE) The Xpert Xpress SARS-CoV-2/FLU/RSV plus assay is intended as an aid in the diagnosis of influenza from Nasopharyngeal swab specimens and should not be used as a sole basis for treatment. Nasal washings and aspirates are unacceptable for Xpert Xpress SARS-CoV-2/FLU/RSV testing.  Fact Sheet for Patients: BloggerCourse.com  Fact Sheet for Healthcare Providers: SeriousBroker.it  This test is not yet approved or cleared by the [place] FDA and has been authorized for detection and/or diagnosis of SARS-CoV-2 by FDA under an Emergency Use Authorization (EUA). This EUA will remain in effect (meaning this test can be used) for the duration of the COVID-19 declaration under Section 564(b)(1) of the Act, 21 U.S.C. section 360bbb-3(b)(1), unless the authorization is terminated or revoked.  Performed at [facility], [address] [place], [place] [address]       Radiology Studies: DG Chest 2 View  Result Date: 11/30/2020 CLINICAL DATA:  Shortness of breath, chest pain EXAM: CHEST - 2 VIEW COMPARISON:  10/06/2020 FINDINGS: Mild cardiomegaly. Diffuse bilateral interstitial pulmonary opacity. Disc degenerative disease of the thoracic spine IMPRESSION: Mild cardiomegaly with diffuse bilateral interstitial pulmonary opacity, likely edema. No focal airspace opacity. Electronically Signed   By: Lauralyn Primes M.D.   On: 11/30/2020 12:16    Scheduled Meds: . budesonide (PULMICORT) nebulizer solution  0.25 mg Nebulization BID  . enoxaparin (LOVENOX) injection  40 mg Subcutaneous Q24H  . gabapentin  900 mg Oral TID  . guaiFENesin  1,200 mg Oral BID  . insulin aspart  0-15 Units Subcutaneous TID WC  . insulin aspart  0-5 Units Subcutaneous QHS  . insulin aspart  3 Units Subcutaneous TID WC  . insulin glargine  10 Units Subcutaneous  Daily  . ipratropium-albuterol  3 mL Nebulization Q6H  . methylPREDNISolone (SOLU-MEDROL) injection  60 mg Intravenous Q6H  . metoprolol tartrate  100 mg Oral BID  . montelukast  10 mg Oral QHS   Continuous Infusions:   LOS: 1 day   Time spent: 35 minutes.  Tyrone Nine, MD [facility] [email or web address] 12/01/2020, 12:02 PM

## 2020-12-02 ENCOUNTER — Other Ambulatory Visit (HOSPITAL_COMMUNITY): Payer: Medicaid Other

## 2020-12-02 ENCOUNTER — Encounter (HOSPITAL_COMMUNITY): Payer: Self-pay | Admitting: Internal Medicine

## 2020-12-02 LAB — GLUCOSE, CAPILLARY
Glucose-Capillary: 222 mg/dL — ABNORMAL HIGH (ref 70–99)
Glucose-Capillary: 302 mg/dL — ABNORMAL HIGH (ref 70–99)
Glucose-Capillary: 283 mg/dL — ABNORMAL HIGH (ref 70–99)
Glucose-Capillary: 286 mg/dL — ABNORMAL HIGH (ref 70–99)

## 2020-12-02 LAB — MAGNESIUM: Magnesium: 1.6 mg/dL — ABNORMAL LOW (ref 1.7–2.4)

## 2020-12-02 LAB — BASIC METABOLIC PANEL
Anion gap: 10 (ref 5–15)
BUN: 21 mg/dL (ref 8–23)
CO2: 37 mmol/L — ABNORMAL HIGH (ref 22–32)
Calcium: 9 mg/dL (ref 8.9–10.3)
Chloride: 91 mmol/L — ABNORMAL LOW (ref 98–111)
Creatinine, Ser: 0.91 mg/dL (ref 0.44–1.00)
GFR, Estimated: >60 mL/min (ref >60)
Glucose, Bld: 188 mg/dL — ABNORMAL HIGH (ref 70–99)
Potassium: 4.1 mmol/L (ref 3.5–5.1)
Sodium: 138 mmol/L (ref 135–145)

## 2020-12-02 MED ORDER — HYDRALAZINE HCL 25 MG PO TABS
25.0000 mg | ORAL_TABLET | Freq: Three times a day (TID) | ORAL | Status: DC
  Administered 2020-12-02 – 2020-12-04 (×7): 25 mg via ORAL
  Filled 2020-12-02 (×7): qty 1

## 2020-12-02 MED ORDER — FUROSEMIDE 10 MG/ML IJ SOLN
60.0000 mg | Freq: Once | INTRAMUSCULAR | Status: AC
  Administered 2020-12-02: 60 mg via INTRAVENOUS
  Filled 2020-12-02: qty 6

## 2020-12-02 MED ORDER — INSULIN GLARGINE 100 UNIT/ML ~~LOC~~ SOLN
15.0000 [IU] | Freq: Every day | SUBCUTANEOUS | Status: DC
  Filled 2020-12-02: qty 0.15

## 2020-12-02 MED ORDER — MAGNESIUM SULFATE 2 GM/50ML IV SOLN
2.0000 g | Freq: Once | INTRAVENOUS | Status: AC
  Administered 2020-12-02: 2 g via INTRAVENOUS
  Filled 2020-12-02: qty 50

## 2020-12-02 MED ORDER — INSULIN ASPART 100 UNIT/ML IJ SOLN
5.0000 [IU] | Freq: Three times a day (TID) | INTRAMUSCULAR | Status: DC
  Administered 2020-12-02 (×2): 5 [IU] via SUBCUTANEOUS

## 2020-12-02 MED ORDER — METHYLPREDNISOLONE SODIUM SUCC 40 MG IJ SOLR
40.0000 mg | Freq: Three times a day (TID) | INTRAMUSCULAR | Status: DC
  Administered 2020-12-02 – 2020-12-04 (×7): 40 mg via INTRAVENOUS
  Filled 2020-12-02 (×7): qty 1

## 2020-12-02 NOTE — Progress Notes (Signed)
PROGRESS NOTE  Julia Henderson  ZOX:[id number] DOB: 02-10-1961 DOA: 11/30/2020 PCP: Pcp, No  Brief Narrative: Julia Henderson is a 60 y.o. female with a history of COPD, 2L -O2-dependent respiratory failure since covid-19 infection earlier this year, HTN, HLD, IDT2DM, and morbid obesity who presented to the ED with shortness of breath and worsening hypoxia found to be tachypneic, wheezing with cardiomegaly, bilateral interstitial opacities suspicious for edema without focal infiltrate on CXR. Troponin negative x2, WBC wnl, PCT negative, BNP <100, covid and influenza PCR negative. Lasix, steroids, and nebulized treatments were given and patient was admitted.  Assessment & Plan: Principal Problem:   COPD exacerbation (HCC) Active Problems:   Acute on chronic respiratory failure (HCC)   Pulmonary edema   Hypertensive urgency   Hyperlipidemia  Acute on chronic hypoxic respiratory failure: Likely multifactorial including primarily COPD exacerbation, as well as pulmonary edema on suspected OSA/OHS, postinflammatory respiratory failure following covid-19 infection.  - Continue supplemental oxygen as needed, remain in PCU. Does to 4.5L this AM.  - Continue diuresis as below - Continue steroids for COPD exacerbation, decrease dose to minimize side effects. Still some wheezing but much better aeration. - Continue scheduled and prn nebs, home singulair.  - Continue azithromycin x5 days  - Very strongly feel pulmonary follow up and sleep study are indicated for this patient.  HTN:  - Restarted home metoprolol 100mg  po BID.  - Start hydralazine TID. Could be candidate for norvasc, though wouldn't want to wonder to what we should attribute continued leg swelling at this time. Depending on echo, may need alternative agents.   IDT2DM: New Dx at time of last hospitalization in wake of covid-19. HbA1c was 10.3% 3/21 (about 6 weeks ago). - Restarted lantus, will increase to 15u daily, increase mealtime insulin  and continue SSI. Being less aggressive in setting of decreasing steroid dose.  Chronic pain:  - Continue home gabapentin 900mg  TID  Acute pulmonary edema: Likely due to acute HFpEF given elevated BP, obesity.  - Repeat lasix 60mg  IV today, monitoring BMP (SCr up though not significantly elevated and having edema still).  - Monitor I/O, weights, BMP, Mg - Pt has declined echo two days in a row now. States she's amenable to doing this 5/17.  Hypomagnesemia:  - Supplement and monitor  Morbid obesity: Estimated body mass index is 50.73 kg/m as calculated from the following:   Height as of 09/13/20: 5\' 1"  (1.549 m).   Weight as of 10/07/20: 121.8 kg.  DVT prophylaxis: Lovenox 40mg  currently. Will order 0.5mg /kg once weight is measured. Code Status: Full Family Communication: Daughter by speaker phone throughout encounter Disposition Plan:  Status is: Inpatient  Remains inpatient appropriate because:Ongoing diagnostic testing needed not appropriate for outpatient work up and Inpatient level of care appropriate due to severity of illness  Dispo: The patient is from: Home              Anticipated d/c is to: TBD              Patient currently is not medically stable to d/c.   Difficult to place patient No  Consultants:   None  Procedures:   Echocardiogram ordered  Antimicrobials:  Azithromycin 5/15 - 5/19   Subjective: Pt feels breathing is better/easier, though still dyspneic at rest and wheezing though this is improved with breathing treatments. Has turned down echo twice, when asked she reports she doesn't want a diagnosis of CHF like her father had which caused the family to set a  life expectancy on him and didn't help any treatment.   Objective: Vitals:   12/01/20 2126 12/02/20 0206 12/02/20 0619 12/02/20 0920  BP: (!) 167/111  (!) 178/107   Pulse:   90   Resp: 20  20   Temp: 98.1 F (36.7 C)  97.6 F (36.4 C)   TempSrc: Oral  Oral   SpO2: 94% 99% 100% 93%     Intake/Output Summary (Last 24 hours) at 12/02/2020 1051 Last data filed at 12/01/2020 1754 Gross per 24 hour  Intake --  Output 1550 ml  Net -1550 ml   Gen: Obese female in no distress Pulm: Nonlabored tachypnea with endexpiratory wheezing, scant bibasilar crackles. CV: Regular borderline tachycardia without murmur, rub, or gallop. UTD JVD, 1+ pitting dependent edema. GI: Abdomen soft, non-tender, non-distended, with normoactive bowel sounds.  Ext: Warm, no deformities Skin: No new rashes, lesions or ulcers on visualized skin. Neuro: Alert and oriented. No focal neurological deficits. Psych: Judgement and insight appear fair. Mood euthymic & affect congruent. Behavior is appropriate.    Data Reviewed: I have personally reviewed following labs and imaging studies  CBC: Recent Labs  Lab 11/30/20 1152  WBC 5.5  HGB 12.1  HCT 40.8  MCV 101.0*  PLT 180   Basic Metabolic Panel: Recent Labs  Lab 11/30/20 1152 12/02/20 0333  NA 140 138  K 3.6 4.1  CL 94* 91*  CO2 39* 37*  GLUCOSE 113* 188*  BUN 9 21  CREATININE 0.68 0.91  CALCIUM 8.7* 9.0  MG  --  1.6*   GFR: CrCl cannot be calculated (Unknown ideal weight.). Liver Function Tests: Recent Labs  Lab 11/30/20 1305  AST 47*  ALT 29  ALKPHOS 44  BILITOT 1.0  PROT 7.4  ALBUMIN 3.4*   No results for input(s): LIPASE, AMYLASE in the last 168 hours. No results for input(s): AMMONIA in the last 168 hours. Coagulation Profile: Recent Labs  Lab 11/30/20 1305  INR 1.1   Cardiac Enzymes: No results for input(s): CKTOTAL, CKMB, CKMBINDEX, TROPONINI in the last 168 hours. BNP (last 3 results) No results for input(s): PROBNP in the last 8760 hours. HbA1C: No results for input(s): HGBA1C in the last 72 hours. CBG: Recent Labs  Lab 12/01/20 0920 12/01/20 1152 12/01/20 1623 12/01/20 2110 12/02/20 0830  GLUCAP 292* 335* 162* 277* 222*   Lipid Profile: No results for input(s): CHOL, HDL, LDLCALC, TRIG, CHOLHDL,  LDLDIRECT in the last 72 hours. Thyroid Function Tests: Recent Labs    11/30/20 1306  TSH 2.128   Anemia Panel: No results for input(s): VITAMINB12, FOLATE, FERRITIN, TIBC, IRON, RETICCTPCT in the last 72 hours. Urine analysis:    Component Value Date/Time   COLORURINE YELLOW 11/30/2020 1530   APPEARANCEUR CLEAR 11/30/2020 1530   LABSPEC 1.008 11/30/2020 1530   PHURINE 6.0 11/30/2020 1530   GLUCOSEU NEGATIVE 11/30/2020 1530   HGBUR NEGATIVE 11/30/2020 1530   BILIRUBINUR NEGATIVE 11/30/2020 1530   KETONESUR 5 (A) 11/30/2020 1530   PROTEINUR 30 (A) 11/30/2020 1530   NITRITE NEGATIVE 11/30/2020 1530   LEUKOCYTESUR NEGATIVE 11/30/2020 1530   Recent Results (from the past 240 hour(s))  Resp Panel by RT-PCR (Flu A&B, Covid) Nasopharyngeal Swab     Status: None   Collection Time: 11/30/20 12:36 PM   Specimen: Nasopharyngeal Swab; Nasopharyngeal(NP) swabs in vial transport medium  Result Value Ref Range Status   SARS Coronavirus 2 by RT PCR NEGATIVE NEGATIVE Final    Comment: (NOTE) SARS-CoV-2 target nucleic acids are NOT  DETECTED.  The SARS-CoV-2 RNA is generally detectable in upper respiratory specimens during the acute phase of infection. The lowest concentration of SARS-CoV-2 viral copies this assay can detect is 138 copies/mL. A negative result does not preclude SARS-Cov-2 infection and should not be used as the sole basis for treatment or other patient management decisions. A negative result may occur with  improper specimen collection/handling, submission of specimen other than nasopharyngeal swab, presence of viral mutation(s) within the areas targeted by this assay, and inadequate number of viral copies(<138 copies/mL). A negative result must be combined with clinical observations, patient history, and epidemiological information. The expected result is Negative.  Fact Sheet for Patients:  BloggerCourse.comhttps://www.fda.gov/media/152166/download  Fact Sheet for Healthcare  Providers:  SeriousBroker.ithttps://www.fda.gov/media/152162/download  This test is no t yet approved or cleared by the [place] FDA and  has been authorized for detection and/or diagnosis of SARS-CoV-2 by FDA under an Emergency Use Authorization (EUA). This EUA will remain  in effect (meaning this test can be used) for the duration of the COVID-19 declaration under Section 564(b)(1) of the Act, 21 U.S.C.section 360bbb-3(b)(1), unless the authorization is terminated  or revoked sooner.       Influenza A by PCR NEGATIVE NEGATIVE Final   Influenza B by PCR NEGATIVE NEGATIVE Final    Comment: (NOTE) The Xpert Xpress SARS-CoV-2/FLU/RSV plus assay is intended as an aid in the diagnosis of influenza from Nasopharyngeal swab specimens and should not be used as a sole basis for treatment. Nasal washings and aspirates are unacceptable for Xpert Xpress SARS-CoV-2/FLU/RSV testing.  Fact Sheet for Patients: BloggerCourse.comhttps://www.fda.gov/media/152166/download  Fact Sheet for Healthcare Providers: SeriousBroker.ithttps://www.fda.gov/media/152162/download  This test is not yet approved or cleared by the [place] FDA and has been authorized for detection and/or diagnosis of SARS-CoV-2 by FDA under an Emergency Use Authorization (EUA). This EUA will remain in effect (meaning this test can be used) for the duration of the COVID-19 declaration under Section 564(b)(1) of the Act, 21 U.S.C. section 360bbb-3(b)(1), unless the authorization is terminated or revoked.  Performed at [facility], [address] [place], [place] [address]   Urine culture     Status: Abnormal   Collection Time: 11/30/20  1:07 PM   Specimen: Urine, Random  Result Value Ref Range Status   Specimen Description URINE, RANDOM  Final   Special Requests NONE  Final   Culture (A)  Final    <10,000 COLONIES/mL INSIGNIFICANT GROWTH Performed at [facility], [address] [place], [place] [address]    Report Status 12/01/2020 FINAL  Final       Radiology Studies: DG Chest 2 View  Result Date: 11/30/2020 CLINICAL DATA:  Shortness of breath, chest pain EXAM: CHEST - 2 VIEW COMPARISON:  10/06/2020 FINDINGS: Mild cardiomegaly. Diffuse bilateral interstitial pulmonary opacity. Disc degenerative disease of the thoracic spine IMPRESSION: Mild cardiomegaly with diffuse bilateral interstitial pulmonary opacity, likely edema. No focal airspace opacity. Electronically Signed   By: Lauralyn PrimesAlex  Bibbey M.D.   On: 11/30/2020 12:16    Scheduled Meds: . azithromycin  500 mg Oral Daily  . budesonide (PULMICORT) nebulizer solution  0.25 mg Nebulization BID  . enoxaparin (LOVENOX) injection  40 mg Subcutaneous Q24H  . furosemide  60 mg Intravenous Once  . gabapentin  900 mg Oral TID  . guaiFENesin  1,200 mg Oral BID  . hydrALAZINE  25 mg Oral Q8H  . insulin aspart  0-15 Units Subcutaneous TID WC  . insulin aspart  0-5 Units Subcutaneous  QHS  . insulin aspart  5 Units Subcutaneous TID WC  . [START ON 12/03/2020] insulin glargine  15 Units Subcutaneous Daily  . ipratropium-albuterol  3 mL Nebulization Q6H  . methylPREDNISolone (SOLU-MEDROL) injection  40 mg Intravenous Q8H  . metoprolol tartrate  100 mg Oral BID  . montelukast  10 mg Oral QHS   Continuous Infusions: . magnesium sulfate bolus IVPB       LOS: 2 days   Time spent: 35 minutes.  Tyrone Nine, MD [facility] [email or web address] 12/02/2020, 10:51 AM

## 2020-12-02 NOTE — Progress Notes (Signed)
Inpatient Diabetes Program Recommendations  AACE/ADA: New Consensus Statement on Inpatient Glycemic Control (2015)  Target Ranges:  Prepandial:   less than 140 mg/dL      Peak postprandial:   less than 180 mg/dL (1-2 hours)      Critically ill patients:  140 - 180 mg/dL   Results for Julia Henderson, Julia Henderson (MRN [id number]) as of 12/02/2020 10:38  Ref. Range 12/01/2020 07:46 12/01/2020 09:20 12/01/2020 11:52 12/01/2020 16:23 12/01/2020 21:10  Glucose-Capillary Latest Ref Range: 70 - 99 mg/dL 962 (H) 836 (H)  8  units NOVOLOG  335 (H)  14 units NOVOLOG  10 units LANTUS  162 (H)  6 units NOVOLOG  277 (H)  3 units NOVOLOG    Results for Julia Henderson, Julia Henderson (MRN [id number]) as of 12/02/2020 10:38  Ref. Range 12/02/2020 08:30  Glucose-Capillary Latest Ref Range: 70 - 99 mg/dL 503 (H)  8 units NOVOLOG  10 units LANTUS    Admit with: Acute on chronic hypoxic respiratory failure: Likely multifactorial including primarily COPD exacerbation, as well as pulmonary edema on suspected OSA/OHS, postinflammatory respiratory failure following covid-19 infection  History: DM  Home DM Meds: Lantus 25 units Daily  Current Orders: Lantus 10 units Daily      Novolog 3 units TID with meals      Novolog Moderate Correction Scale/ SSI (0-15 units) TID AC + HS      Note Solumedrol reduced to 40 mg Q8 hours (was 60 mg Q6 hours)   MD- Note AM CBG remains elevated despite addition of 50% home dose Lantus yest AM  Please consider:  1. Increase Lantus to 15 units Daily  2. Increase Novolog Meal Coverage to 5 units TID with meals     --Will follow patient during hospitalization--  Ambrose Finland RN, MSN, CDE Diabetes Coordinator Inpatient Glycemic Control Team Team Pager: [phone] (8a-5p)

## 2020-12-03 ENCOUNTER — Inpatient Hospital Stay (HOSPITAL_COMMUNITY): Payer: Medicaid Other

## 2020-12-03 LAB — BASIC METABOLIC PANEL
Anion gap: 15 (ref 5–15)
BUN: 29 mg/dL — ABNORMAL HIGH (ref 8–23)
CO2: 32 mmol/L (ref 22–32)
Calcium: 9.1 mg/dL (ref 8.9–10.3)
Chloride: 91 mmol/L — ABNORMAL LOW (ref 98–111)
Creatinine, Ser: 0.95 mg/dL (ref 0.44–1.00)
GFR, Estimated: >60 mL/min (ref >60)
Glucose, Bld: 212 mg/dL — ABNORMAL HIGH (ref 70–99)
Potassium: 5.2 mmol/L — ABNORMAL HIGH (ref 3.5–5.1)
Sodium: 138 mmol/L (ref 135–145)

## 2020-12-03 LAB — GLUCOSE, CAPILLARY
Glucose-Capillary: 210 mg/dL — ABNORMAL HIGH (ref 70–99)
Glucose-Capillary: 197 mg/dL — ABNORMAL HIGH (ref 70–99)
Glucose-Capillary: 282 mg/dL — ABNORMAL HIGH (ref 70–99)
Glucose-Capillary: 206 mg/dL — ABNORMAL HIGH (ref 70–99)
Glucose-Capillary: 147 mg/dL — ABNORMAL HIGH (ref 70–99)
Glucose-Capillary: 222 mg/dL — ABNORMAL HIGH (ref 70–99)

## 2020-12-03 LAB — MAGNESIUM: Magnesium: 2.1 mg/dL (ref 1.7–2.4)

## 2020-12-03 MED ORDER — IPRATROPIUM-ALBUTEROL 0.5-2.5 (3) MG/3ML IN SOLN
3.0000 mL | Freq: Two times a day (BID) | RESPIRATORY_TRACT | Status: DC
  Administered 2020-12-03 – 2020-12-04 (×3): 3 mL via RESPIRATORY_TRACT
  Filled 2020-12-03 (×3): qty 3

## 2020-12-03 MED ORDER — INSULIN ASPART 100 UNIT/ML IJ SOLN: 6.0000 [IU] | Freq: Three times a day (TID) | INTRAMUSCULAR | Status: DC

## 2020-12-03 MED ORDER — SODIUM ZIRCONIUM CYCLOSILICATE 5 G PO PACK
5.0000 g | PACK | Freq: Once | ORAL | Status: AC
  Administered 2020-12-03: 5 g via ORAL
  Filled 2020-12-03: qty 1

## 2020-12-03 MED ORDER — INSULIN ASPART 100 UNIT/ML IJ SOLN
6.0000 [IU] | Freq: Three times a day (TID) | INTRAMUSCULAR | Status: DC
  Administered 2020-12-03 – 2020-12-04 (×5): 6 [IU] via SUBCUTANEOUS

## 2020-12-03 MED ORDER — INSULIN GLARGINE 100 UNIT/ML ~~LOC~~ SOLN
20.0000 [IU] | Freq: Every day | SUBCUTANEOUS | Status: DC
  Administered 2020-12-03 – 2020-12-04 (×2): 20 [IU] via SUBCUTANEOUS
  Filled 2020-12-03 (×2): qty 0.2

## 2020-12-03 MED ORDER — FUROSEMIDE 10 MG/ML IJ SOLN
60.0000 mg | Freq: Once | INTRAMUSCULAR | Status: AC
  Administered 2020-12-03: 60 mg via INTRAVENOUS
  Filled 2020-12-03: qty 6

## 2020-12-03 NOTE — Progress Notes (Addendum)
PROGRESS NOTE  Julia Henderson  OIZ:[id number] DOB: 31-Aug-1960 DOA: 11/30/2020 PCP: Pcp, No  Brief Narrative: Kayse Puccini is a 60 y.o. female with a history of COPD, 2L -O2-dependent respiratory failure since covid-19 infection earlier this year, HTN, HLD, IDT2DM, and morbid obesity who presented to the ED with shortness of breath and worsening hypoxia found to be tachypneic, wheezing with cardiomegaly, bilateral interstitial opacities suspicious for edema without focal infiltrate on CXR. Troponin negative x2, WBC wnl, PCT negative, BNP <100, covid and influenza PCR negative. Lasix, steroids, and nebulized treatments were given and patient was admitted. She remains hypoxic beyond her previous baseline with wheezing.  Assessment & Plan: Principal Problem:   COPD exacerbation (HCC) Active Problems:   Acute on chronic respiratory failure (HCC)   Pulmonary edema   Hypertensive urgency   Hyperlipidemia  Acute on chronic hypoxic respiratory failure: Likely multifactorial including primarily COPD exacerbation, as well as pulmonary edema on suspected OSA/OHS, postinflammatory respiratory failure following covid-19 infection.  - Continue supplemental oxygen as needed, remain in PCU. - Continue steroids for COPD exacerbation. Still wheezing but much better aeration. - Continue scheduled and prn nebs, home singulair.  - Continue azithromycin x5 days  - Recommend pulmonary follow up and sleep study are indicated for this patient.  HTN:  - Continue home metoprolol 100mg  po BID.  - Start hydralazine TID. Could be candidate for norvasc, though wouldn't want to wonder to what we should attribute continued leg swelling at this time.   IDT2DM: New Dx at time of last hospitalization in wake of covid-19. HbA1c was 10.3% 3/21 (about 6 weeks ago). - Continue lantus, mealtime insulin and continue SSI.  Chronic pain:  - Continue home gabapentin 900mg  TID  Acute pulmonary edema: Likely due to acute HFpEF given  elevated BP, obesity.  - Repeat lasix, monitor BMP, I/O. Pt refusing echocardiogram despite extensive discussion regarding rationale and how this would dictate treatment.   Hypomagnesemia:  - Supplemented  Hyperkalemia:  - Lokelma - Lasix as above - Recheck in AM - Continue telemetry  Morbid obesity: Estimated body mass index is 50.73 kg/m as calculated from the following:   Height as of 09/13/20: 5\' 1"  (1.549 m).   Weight as of 10/07/20: 121.8 kg.  DVT prophylaxis: Lovenox 40mg   Code Status: Full Family Communication: None at bedside Disposition Plan:  Status is: Inpatient  Remains inpatient appropriate because:Ongoing diagnostic testing needed not appropriate for outpatient work up and Inpatient level of care appropriate due to severity of illness  Dispo: The patient is from: Home              Anticipated d/c is to: TBD              Patient currently is not medically stable to d/c.   Difficult to place patient No  Consultants:   None  Procedures:   Echocardiogram ordered  Antimicrobials:  Azithromycin 5/15 - 5/19   Subjective: Fed up with being here, says she's going to leave AMA though she can't walk more than a couple steps due to dyspnea, still on 5L O2 this AM, agrees she's still wheezing. Legs still swollen.  Objective: Vitals:   12/03/20 0427 12/03/20 0737 12/03/20 0826 12/03/20 1149  BP: (!) 145/96  140/83 (!) 147/91  Pulse: 65     Resp: 18  20 18   Temp: 97.8 F (36.6 C)  98.7 F (37.1 C) 98.7 F (37.1 C)  TempSrc: Oral  Oral Oral  SpO2: 96% 96% 95% 92%  Intake/Output Summary (Last 24 hours) at 12/03/2020 1359 Last data filed at 12/03/2020 0610 Gross per 24 hour  Intake --  Output 2600 ml  Net -2600 ml   Gen: 60 y.o. female in no distress Pulm: Tachypneic on 5L O2 at rest with expiratory wheezing throughout. CV: Regular rate and rhythm. No murmur, rub, or gallop. No JVD, trace pitting to dependent edema. GI: Abdomen soft, non-tender,  non-distended, with normoactive bowel sounds.  Ext: Warm, no deformities Skin: No new rashes, lesions or ulcers on visualized skin. Neuro: Alert and oriented. No focal neurological deficits. Psych: Judgement and insight appear fair. Mood anxious & affect congruent. Behavior is appropriate.    Data Reviewed: I have personally reviewed following labs and imaging studies  CBC: Recent Labs  Lab 11/30/20 1152  WBC 5.5  HGB 12.1  HCT 40.8  MCV 101.0*  PLT 180   Basic Metabolic Panel: Recent Labs  Lab 11/30/20 1152 12/02/20 0333 12/03/20 0312 12/03/20 0645  NA 140 138 138  --   K 3.6 4.1 5.2*  --   CL 94* 91* 91*  --   CO2 39* 37* 32  --   GLUCOSE 113* 188* 212*  --   BUN 9 21 29*  --   CREATININE 0.68 0.91 0.95  --   CALCIUM 8.7* 9.0 9.1  --   MG  --  1.6*  --  2.1   GFR: CrCl cannot be calculated (Unknown ideal weight.). Liver Function Tests: Recent Labs  Lab 11/30/20 1305  AST 47*  ALT 29  ALKPHOS 44  BILITOT 1.0  PROT 7.4  ALBUMIN 3.4*   No results for input(s): LIPASE, AMYLASE in the last 168 hours. No results for input(s): AMMONIA in the last 168 hours. Coagulation Profile: Recent Labs  Lab 11/30/20 1305  INR 1.1   Cardiac Enzymes: No results for input(s): CKTOTAL, CKMB, CKMBINDEX, TROPONINI in the last 168 hours. BNP (last 3 results) No results for input(s): PROBNP in the last 8760 hours. HbA1C: No results for input(s): HGBA1C in the last 72 hours. CBG: Recent Labs  Lab 12/02/20 1622 12/02/20 2010 12/03/20 0606 12/03/20 0729 12/03/20 1140  GLUCAP 283* 286* 210* 197* 282*   Lipid Profile: No results for input(s): CHOL, HDL, LDLCALC, TRIG, CHOLHDL, LDLDIRECT in the last 72 hours. Thyroid Function Tests: No results for input(s): TSH, T4TOTAL, FREET4, T3FREE, THYROIDAB in the last 72 hours. Anemia Panel: No results for input(s): VITAMINB12, FOLATE, FERRITIN, TIBC, IRON, RETICCTPCT in the last 72 hours. Urine analysis:    Component Value  Date/Time   COLORURINE YELLOW 11/30/2020 1530   APPEARANCEUR CLEAR 11/30/2020 1530   LABSPEC 1.008 11/30/2020 1530   PHURINE 6.0 11/30/2020 1530   GLUCOSEU NEGATIVE 11/30/2020 1530   HGBUR NEGATIVE 11/30/2020 1530   BILIRUBINUR NEGATIVE 11/30/2020 1530   KETONESUR 5 (A) 11/30/2020 1530   PROTEINUR 30 (A) 11/30/2020 1530   NITRITE NEGATIVE 11/30/2020 1530   LEUKOCYTESUR NEGATIVE 11/30/2020 1530   Recent Results (from the past 240 hour(s))  Resp Panel by RT-PCR (Flu A&B, Covid) Nasopharyngeal Swab     Status: None   Collection Time: 11/30/20 12:36 PM   Specimen: Nasopharyngeal Swab; Nasopharyngeal(NP) swabs in vial transport medium  Result Value Ref Range Status   SARS Coronavirus 2 by RT PCR NEGATIVE NEGATIVE Final    Comment: (NOTE) SARS-CoV-2 target nucleic acids are NOT DETECTED.  The SARS-CoV-2 RNA is generally detectable in upper respiratory specimens during the acute phase of infection. The lowest concentration of SARS-CoV-2  viral copies this assay can detect is 138 copies/mL. A negative result does not preclude SARS-Cov-2 infection and should not be used as the sole basis for treatment or other patient management decisions. A negative result may occur with  improper specimen collection/handling, submission of specimen other than nasopharyngeal swab, presence of viral mutation(s) within the areas targeted by this assay, and inadequate number of viral copies(<138 copies/mL). A negative result must be combined with clinical observations, patient history, and epidemiological information. The expected result is Negative.  Fact Sheet for Patients:  BloggerCourse.com  Fact Sheet for Healthcare Providers:  SeriousBroker.it  This test is no t yet approved or cleared by the [place] FDA and  has been authorized for detection and/or diagnosis of SARS-CoV-2 by FDA under an Emergency Use Authorization (EUA). This EUA will  remain  in effect (meaning this test can be used) for the duration of the COVID-19 declaration under Section 564(b)(1) of the Act, 21 U.S.C.section 360bbb-3(b)(1), unless the authorization is terminated  or revoked sooner.       Influenza A by PCR NEGATIVE NEGATIVE Final   Influenza B by PCR NEGATIVE NEGATIVE Final    Comment: (NOTE) The Xpert Xpress SARS-CoV-2/FLU/RSV plus assay is intended as an aid in the diagnosis of influenza from Nasopharyngeal swab specimens and should not be used as a sole basis for treatment. Nasal washings and aspirates are unacceptable for Xpert Xpress SARS-CoV-2/FLU/RSV testing.  Fact Sheet for Patients: BloggerCourse.com  Fact Sheet for Healthcare Providers: SeriousBroker.it  This test is not yet approved or cleared by the [place] FDA and has been authorized for detection and/or diagnosis of SARS-CoV-2 by FDA under an Emergency Use Authorization (EUA). This EUA will remain in effect (meaning this test can be used) for the duration of the COVID-19 declaration under Section 564(b)(1) of the Act, 21 U.S.C. section 360bbb-3(b)(1), unless the authorization is terminated or revoked.  Performed at [facility], [address] [place], [place] [address]   Urine culture     Status: Abnormal   Collection Time: 11/30/20  1:07 PM   Specimen: Urine, Random  Result Value Ref Range Status   Specimen Description URINE, RANDOM  Final   Special Requests NONE  Final   Culture (A)  Final    <10,000 COLONIES/mL INSIGNIFICANT GROWTH Performed at [facility], [address], [place], [place] [address]    Report Status 12/01/2020 FINAL  Final      Radiology Studies: No results found.  Scheduled Meds: . azithromycin  500 mg Oral Daily  . budesonide (PULMICORT) nebulizer solution  0.25 mg Nebulization BID  . enoxaparin (LOVENOX) injection  40 mg Subcutaneous Q24H  . gabapentin  900 mg Oral  TID  . guaiFENesin  1,200 mg Oral BID  . hydrALAZINE  25 mg Oral Q8H  . insulin aspart  0-15 Units Subcutaneous TID WC  . insulin aspart  0-5 Units Subcutaneous QHS  . insulin aspart  6 Units Subcutaneous TID WC  . insulin glargine  20 Units Subcutaneous Daily  . ipratropium-albuterol  3 mL Nebulization BID  . methylPREDNISolone (SOLU-MEDROL) injection  40 mg Intravenous Q8H  . metoprolol tartrate  100 mg Oral BID  . montelukast  10 mg Oral QHS   Continuous Infusions:    LOS: 3 days   Time spent: 35 minutes.  Tyrone Nine, MD [facility] [email or web address] 12/03/2020, 1:59 PM

## 2020-12-03 NOTE — Evaluation (Signed)
Occupational Therapy Treatment Patient Details Name: Julia Henderson MRN: [id number] DOB: May 29, 1961 Today's Date: 12/03/2020    History of present illness 60 y.o. female presented to the ED 11/30/20 for evaluation of shortness of breath and hypoxia. PMH-- COPD/chronic bronchitis, COVID 08/2020, chronic respiratory failure on 2 L home oxygen, hypertension, hyperlipidemia, insulin-dependent type 2 diabetes, morbid obesity   OT comments  PTA patient was living with family in a private residence and was grossly Mod I with ADLs/IADLs with recent use of AD. Patient currently functioning near baseline demonstrating observed ADLs including bathing in sitting/standing at sink level, UB dressing, and ADL transfers with Mod I to supervision A. Patient also limited by deficits listed below including decreased cardiopulmonary endurance on 4L O2 via [place] with SpO2 >90% and generalized muscle weakness and would benefit from continued acute OT services in prep for safe d/c home.    Follow Up Recommendations  No OT follow up    Equipment Recommendations  None recommended by OT (Patient has necessary DME)    Recommendations for Other Services      Precautions / Restrictions Precautions Precautions: None Restrictions Weight Bearing Restrictions: No       Mobility Bed Mobility               General bed mobility comments: Patient seated at sink for bathing task upon entry.    Transfers Overall transfer level: Modified independent Equipment used: None             General transfer comment: From straight-backed chair    Balance Overall balance assessment: Mild deficits observed, not formally tested                                         ADL either performed or assessed with clinical judgement   ADL Overall ADL's : At baseline                                       General ADL Comments: Completes bathing in sitting/standing at sink level and UB dressing  with Mod I. Requires increased need for rest breaks secondary to SOB.     Vision       Perception     Praxis      Cognition Arousal/Alertness: Awake/alert Behavior During Therapy: WFL for tasks assessed/performed Overall Cognitive Status: Within Functional Limits for tasks assessed                                          Exercises     Shoulder Instructions       General Comments SpO2 >90% on throughout on 4L via [place].    Pertinent Vitals/ Pain       Pain Assessment: No/denies pain  Home Living Family/patient expects to be discharged to:: Private residence Living Arrangements: Children Available Help at Discharge: Family;Available 24 hours/day Type of Home: Apartment Home Access: Stairs to enter Entrance Stairs-Number of Steps: 2 Entrance Stairs-Rails: Right Home Layout: One level     Bathroom Shower/Tub: Tub/shower unit;Curtain   Firefighter: Standard     Home Equipment: Environmental consultant - 2 wheels;Cane - single point;Adaptive equipment;Bedside commode;Walker - 4 wheels Adaptive Equipment: Reacher;Long-handled shoe horn;Other (Comment) (Toilet aid)  Prior Functioning/Environment Level of Independence: Needs assistance  Gait / Transfers Assistance Needed: began using a RW due to knee pain (x 1 week); using 2L when needed (if dropped below 90%); has rollator for outside ADL's / Homemaking Assistance Needed: uses BSC in shower       Frequency  Min 2X/week        Progress Toward Goals  OT Goals(current goals can now be found in the care plan section)     Acute Rehab OT Goals Patient Stated Goal: go home OT Goal Formulation: With patient Time For Goal Achievement: 12/17/20 Potential to Achieve Goals: Good  Plan      Co-evaluation                 AM-PAC OT "6 Clicks" Daily Activity     Outcome Measure   Help from another person eating meals?: None Help from another person taking care of personal grooming?: None Help  from another person toileting, which includes using toliet, bedpan, or urinal?: None Help from another person bathing (including washing, rinsing, drying)?: A Little Help from another person to put on and taking off regular upper body clothing?: None Help from another person to put on and taking off regular lower body clothing?: A Little 6 Click Score: 22    End of Session Equipment Utilized During Treatment: Rolling walker  OT Visit Diagnosis: Muscle weakness (generalized) (M62.81)   Activity Tolerance Patient tolerated treatment well   Patient Left in chair;with call bell/phone within reach   Nurse Communication          Time: 3825-0539 OT Time Calculation (min): 20 min  Charges: OT General Charges $OT Visit: 1 Visit OT Evaluation $OT Eval Low Complexity: 1 Low  Yehoshua Vitelli H. OTR/L Supplemental OT, Department of rehab services [phone]   Nalee Lightle R H. 12/03/2020, 11:28 AM

## 2020-12-03 NOTE — Progress Notes (Signed)
Julia Henderson is requesting that the echocardiogram be canceled. She would like to focus on one issue at a time at this moment. If the situation changes please reach out to [place] at [phone].  Leta Jungling RDCS

## 2020-12-03 NOTE — Progress Notes (Signed)
Physical Therapy Evaluation Patient Details Name: Julia Henderson MRN: [id number] DOB: 10-04-1960 Today's Date: 12/03/2020   History of Present Illness  60 y.o. female presented to the ED 11/30/20 for evaluation of shortness of breath and hypoxia. PMH-- COPD/chronic bronchitis, COVID 08/2020, chronic respiratory failure on 2 L home oxygen, hypertension, hyperlipidemia, insulin-dependent type 2 diabetes, morbid obesity  Clinical Impression   Pt admitted secondary to problem above with deficits below. PTA patient was walking modified independent with RW using 2L oxygen and able to walk room to room. Pt currently requires 4L oxygen to maintain sats 90-91% while walking 80 ft with RW with antalgic gait due to Rt knee pain.  Will continue to follow acutely to maximize functional mobility independence and safety.       Follow Up Recommendations No PT follow up    Equipment Recommendations  None recommended by PT    Recommendations for Other Services OT consult     Precautions / Restrictions Precautions Precautions: None      Mobility  Bed Mobility               General bed mobility comments: up in recliner    Transfers Overall transfer level: Modified independent Equipment used: None             General transfer comment: stand-pivot to/from [facility] without assist  Ambulation/Gait Ambulation/Gait assistance: Modified independent (Device/Increase time) Gait Distance (Feet): 80 Feet Assistive device: Rolling walker (2 wheeled) Gait Pattern/deviations: Step-through pattern;Antalgic Gait velocity: appropriately decr for CP status   General Gait Details: rt knee pain; initially on 6L (tank does not do 5L) with sats 94-97%; decr to 4L with sats 90-91% walking and 93% at rest  Stairs            Wheelchair Mobility    Modified Rankin (Stroke Patients Only)       Balance Overall balance assessment: Mild deficits observed, not formally tested                                            Pertinent Vitals/Pain Pain Assessment: No/denies pain    Home Living Family/patient expects to be discharged to:: Private residence Living Arrangements: Children Available Help at Discharge: Family;Available 24 hours/day Type of Home: Apartment Home Access: Stairs to enter Entrance Stairs-Rails: Right Entrance Stairs-Number of Steps: 2 Home Layout: One level Home Equipment: Walker - 2 wheels;Cane - single point;Adaptive equipment;Bedside commode;Walker - 4 wheels      Prior Function Level of Independence: Needs assistance   Gait / Transfers Assistance Needed: began using a RW due to knee pain (x 1 week); using 2L when needed (if dropped below 90%); has rollator for outside  ADL's / Homemaking Assistance Needed: uses BSC in shower        Hand Dominance   Dominant Hand: Right    Extremity/Trunk Assessment   Upper Extremity Assessment Upper Extremity Assessment: Defer to OT evaluation    Lower Extremity Assessment Lower Extremity Assessment: Generalized weakness (bil edema)    Cervical / Trunk Assessment Cervical / Trunk Assessment: Other exceptions Cervical / Trunk Exceptions: overweight  Communication   Communication: Other (comment) (weak, hoarse voice (her normal per pt))  Cognition Arousal/Alertness: Awake/alert Behavior During Therapy: WFL for tasks assessed/performed Overall Cognitive Status: Within Functional Limits for tasks assessed  General Comments General comments (skin integrity, edema, etc.): Dr. Jarvis Newcomer in during session and lengthy discussion re: her medical status, reason for echo, and whether pt was going to decide to leave AMA. Charges adjusted to reflect this non-therapy time    Exercises     Assessment/Plan    PT Assessment Patient needs continued PT services  PT Problem List Decreased strength;Decreased activity tolerance;Decreased balance;Decreased  mobility;Cardiopulmonary status limiting activity;Obesity       PT Treatment Interventions DME instruction;Gait training;Functional mobility training;Therapeutic activities;Therapeutic exercise;Patient/family education    PT Goals (Current goals can be found in the Care Plan section)  Acute Rehab PT Goals Patient Stated Goal: go home PT Goal Formulation: With patient Time For Goal Achievement: 12/10/20 Potential to Achieve Goals: Good    Frequency Min 3X/week   Barriers to discharge        Co-evaluation               AM-PAC PT "6 Clicks" Mobility  Outcome Measure Help needed turning from your back to your side while in a flat bed without using bedrails?: None Help needed moving from lying on your back to sitting on the side of a flat bed without using bedrails?: None Help needed moving to and from a bed to a chair (including a wheelchair)?: None Help needed standing up from a chair using your arms (e.g., wheelchair or bedside chair)?: None Help needed to walk in hospital room?: None Help needed climbing 3-5 steps with a railing? : A Little 6 Click Score: 23    End of Session Equipment Utilized During Treatment: Oxygen Activity Tolerance: Patient tolerated treatment well Patient left: in chair;with call bell/phone within reach Nurse Communication: Mobility status PT Visit Diagnosis: Difficulty in walking, not elsewhere classified (R26.2)    Time: 0867-6195 PT Time Calculation (min) (ACUTE ONLY): 49 min   Charges:   PT Evaluation $PT Eval Low Complexity: 1 Low PT Treatments $Gait Training: 8-22 mins         Jerolyn Center, PT Pager [phone]   Julia Henderson 12/03/2020, 10:47 AM

## 2020-12-04 ENCOUNTER — Other Ambulatory Visit (HOSPITAL_COMMUNITY): Payer: Self-pay

## 2020-12-04 DIAGNOSIS — R0602 Shortness of breath: Secondary | ICD-10-CM

## 2020-12-04 LAB — BASIC METABOLIC PANEL
Anion gap: 8 (ref 5–15)
BUN: 33 mg/dL — ABNORMAL HIGH (ref 8–23)
CO2: 39 mmol/L — ABNORMAL HIGH (ref 22–32)
Calcium: 8.9 mg/dL (ref 8.9–10.3)
Chloride: 89 mmol/L — ABNORMAL LOW (ref 98–111)
Creatinine, Ser: 0.9 mg/dL (ref 0.44–1.00)
GFR, Estimated: >60 mL/min (ref >60)
Glucose, Bld: 204 mg/dL — ABNORMAL HIGH (ref 70–99)
Potassium: 3.7 mmol/L (ref 3.5–5.1)
Sodium: 136 mmol/L (ref 135–145)

## 2020-12-04 LAB — GLUCOSE, CAPILLARY
Glucose-Capillary: 210 mg/dL — ABNORMAL HIGH (ref 70–99)
Glucose-Capillary: 196 mg/dL — ABNORMAL HIGH (ref 70–99)
Glucose-Capillary: 206 mg/dL — ABNORMAL HIGH (ref 70–99)

## 2020-12-04 MED ORDER — PREDNISONE 10 MG PO TABS
ORAL_TABLET | ORAL | 0 refills | Status: AC
  Filled 2020-12-04: qty 40, 14d supply, fill #0

## 2020-12-04 MED ORDER — AZITHROMYCIN 250 MG PO TABS
ORAL_TABLET | ORAL | 0 refills | Status: DC
  Filled 2020-12-04: qty 2, 2d supply, fill #0

## 2020-12-04 MED ORDER — HYDRALAZINE HCL 25 MG PO TABS
25.0000 mg | ORAL_TABLET | Freq: Three times a day (TID) | ORAL | 0 refills | Status: DC
  Filled 2020-12-04: qty 90, 30d supply, fill #0

## 2020-12-04 NOTE — Discharge Summary (Signed)
Physician Discharge Summary  Julia Henderson YIR:[id number] DOB: [birth year] DOA: 11/30/2020  PCP: Pcp, No  Admit date: 11/30/2020 Discharge date: 12/04/2020  Admitted From: Home Disposition:  Home  Recommendations for Outpatient Follow-up:  1. Follow up with PCP in 1-2 weeks    Discharge Condition:Improved CODE STATUS:Full Diet recommendation: Diabetic, heart healthy   Brief/Interim Summary: 60 y.o. female with a history of COPD, 2L -O2-dependent respiratory failure since covid-19 infection earlier this year, HTN, HLD, IDT2DM, and morbid obesity who presented to the ED with shortness of breath and worsening hypoxia found to be tachypneic, wheezing with cardiomegaly, bilateral interstitial opacities suspicious for edema without focal infiltrate on CXR. Troponin negative x2, WBC wnl, PCT negative, BNP <100, covid and influenza PCR negative. Lasix, steroids, and nebulized treatments were given and patient was admitted. She remains hypoxic beyond her previous baseline with wheezing  Discharge Diagnoses:  Principal Problem:   COPD exacerbation ([place]) Active Problems:   Acute on chronic respiratory failure ([place])   Pulmonary edema   Hypertensive urgency   Hyperlipidemia  Acute on chronic hypoxic respiratory failure: Likely multifactorial including primarily COPD exacerbation, as well as pulmonary edema on suspected OSA/OHS, postinflammatory respiratory failure following covid-19 infection.  - continued on supplemental O2. Pt uses baseline 2L at home and increases it as needed, per pt report - Continue steroids for COPD exacerbation. Wheezing today much improved - While in hospital, was continued on scheduled and prn nebs, home singulair.  - To complete course of azithromycin on d/c - Recommend pulmonary follow up and sleep study are indicated for this patient.  HTN:  - Continue home metoprolol $RemoveBeforeD'100mg'vzFIKkVBuiumez$  po BID.  - Started hydralazine TID.   IDT2DM: New Dx at time of last hospitalization in  wake of covid-19. HbA1c was 10.3% 3/21 (about 6 weeks ago). - Continue lantus, mealtime insulin and continue SSI.  Chronic pain:  - Continue home gabapentin $RemoveBeforeD'900mg'NwvuySiWcMJImq$  TID  Acute pulmonary edema, likely due to HFpEF, unknown EF as pt refused to have 2d echo performed : Likely due to acute HFpEF given elevated BP, obesity.  - Repeat lasix, monitor BMP, I/O. Pt refusing echocardiogram despite extensive discussion regarding rationale and how this would dictate treatment.   Hypomagnesemia:  - Supplemented  Hyperkalemia:  - corrected  Morbid obesity: Estimated body mass index is 50.73 kg/m as calculated from the following:   Height as of 09/13/20: $RemoveBef'5\' 1"'pTMJxdMfAY$  (1.549 m).   Weight as of 10/07/20: 121.8 kg.  Discharge Instructions   Allergies as of 12/04/2020      Reactions   Celexa [citalopram]       Medication List    STOP taking these medications   predniSONE 10 MG (21) Tbpk tablet Commonly known as: STERAPRED UNI-PAK 21 TAB Replaced by: predniSONE 10 MG tablet     TAKE these medications   12 HOUR NASAL DECONGESTANT NA Place 1 spray into the nose daily as needed (congestion).   albuterol 108 (90 Base) MCG/ACT inhaler Commonly known as: VENTOLIN HFA INHALE 2 PUFFS INTO THE LUNGS EVERY 4 (FOUR) HOURS AS NEEDED FOR WHEEZING OR SHORTNESS OF BREATH. What changed: Another medication with the same name was removed. Continue taking this medication, and follow the directions you see here.   aspirin EC 81 MG tablet Take 1 tablet (81 mg total) by mouth daily. Swallow whole.   azithromycin 250 MG tablet Commonly known as: ZITHROMAX Take 1 tab po daily x 2 more days, then stop Start taking on: Dec 05, 2020   benzonatate 100  MG capsule Commonly known as: TESSALON Take 1 capsule (100 mg total) by mouth every 8 (eight) hours as needed for cough.   blood glucose meter kit and supplies Kit Dispense based on patient and insurance preference. Use up to four times daily as directed.    BREZTRI AEROSPHERE IN Inhale 2 puffs into the lungs daily.   cetirizine 10 MG tablet Commonly known as: ZYRTEC Take 10 mg by mouth daily.   gabapentin 300 MG capsule Commonly known as: NEURONTIN Take 3 capsules (900 mg total) by mouth 3 (three) times daily. What changed: Another medication with the same name was removed. Continue taking this medication, and follow the directions you see here.   hydrALAZINE 25 MG tablet Commonly known as: APRESOLINE Take 1 tablet (25 mg total) by mouth every 8 (eight) hours.   Insulin Pen Needle 29G X 5MM Misc Use with insulin   PenTips 32G X 4 MM Misc Generic drug: Insulin Pen Needle USE WITH INSULIN   Lantus SoloStar 100 UNIT/ML Solostar Pen Generic drug: insulin glargine INJECT 25 UNITS INTO THE SKIN DAILY. What changed:   how much to take  how to take this  when to take this  Another medication with the same name was removed. Continue taking this medication, and follow the directions you see here.   metoprolol tartrate 100 MG tablet Commonly known as: LOPRESSOR Take 1 tablet (100 mg total) by mouth 2 (two) times daily. What changed: Another medication with the same name was removed. Continue taking this medication, and follow the directions you see here.   montelukast 10 MG tablet Commonly known as: SINGULAIR Take 1 tablet (10 mg total) by mouth at bedtime. What changed: Another medication with the same name was removed. Continue taking this medication, and follow the directions you see here.   multivitamin tablet Take 1 tablet by mouth daily.   omega-3 acid ethyl esters 1 g capsule Commonly known as: LOVAZA TAKE 1 CAPSULE (1,000 MG TOTAL) BY MOUTH DAILY. What changed:   how much to take  how to take this  when to take this   pantoprazole 40 MG tablet Commonly known as: PROTONIX Take 1 tablet (40 mg total) by mouth daily at 6 (six) AM. What changed: Another medication with the same name was removed. Continue taking  this medication, and follow the directions you see here.   pravastatin 40 MG tablet Commonly known as: PRAVACHOL Take 1 tablet (40 mg total) by mouth at bedtime. What changed: Another medication with the same name was removed. Continue taking this medication, and follow the directions you see here.   predniSONE 10 MG tablet Commonly known as: DELTASONE Take 6 tablets (60 mg total) by mouth daily with breakfast for 3 days, THEN 4 tablets (40 mg total) daily with breakfast for 3 days, THEN 2 tablets (20 mg total) daily with breakfast for 3 days, THEN 1 tablet (10 mg total) daily with breakfast for 3 days, THEN 0.5 tablets (5 mg total) daily with breakfast for 2 days. Start taking on: Dec 04, 2020 Replaces: predniSONE 10 MG (21) Tbpk tablet   traZODone 100 MG tablet Commonly known as: DESYREL Take 1 tablet (100 mg total) by mouth at bedtime. What changed: Another medication with the same name was removed. Continue taking this medication, and follow the directions you see here.   True Metrix Blood Glucose Test test strip Generic drug: glucose blood USE UP TO FOUR TIMES DAILY AS DIRECTED   True Metrix Meter w/Device Kit  USE UP TO FOUR TIMES DAILY AS DIRECTED.   TRUEplus Lancets 28G Misc USE UP TO FOUR TIMES DAILY AS DIRECTED   vitamin C 500 MG tablet Commonly known as: ASCORBIC ACID Take 500 mg by mouth daily.   Vitamin D3 125 MCG (5000 UT) Tabs Take 1 tablet (5,000 Units total) by mouth daily.       Follow-up Information    Follow up with your PCP in 1-2 weeks. Schedule an appointment as soon as possible for a visit.              Allergies  Allergen Reactions  . Celexa [Citalopram]       Procedures/Studies: DG Chest 2 View  Result Date: 11/30/2020 CLINICAL DATA:  Shortness of breath, chest pain EXAM: CHEST - 2 VIEW COMPARISON:  10/06/2020 FINDINGS: Mild cardiomegaly. Diffuse bilateral interstitial pulmonary opacity. Disc degenerative disease of the thoracic spine  IMPRESSION: Mild cardiomegaly with diffuse bilateral interstitial pulmonary opacity, likely edema. No focal airspace opacity. Electronically Signed   By: Eddie Candle M.D.   On: 11/30/2020 12:16     Subjective: Very eager to go home today  Discharge Exam: Vitals:   12/04/20 0940 12/04/20 0941  BP:    Pulse:    Resp:    Temp:    SpO2: 97% 99%   Vitals:   12/04/20 0330 12/04/20 0740 12/04/20 0940 12/04/20 0941  BP: (!) 161/109 (!) 150/89    Pulse: 87 90    Resp: 20 17    Temp: 97.8 F (36.6 C) 98.1 F (36.7 C)    TempSrc: Oral Oral    SpO2: 94% 95% 97% 99%    General: Pt is alert, awake, not in acute distress Cardiovascular: RRR, S1/S2 +, no rubs, no gallops Respiratory: CTA bilaterally, no wheezing, no rhonchi Abdominal: Soft, NT, ND, bowel sounds + Extremities: no edema, no cyanosis   The results of significant diagnostics from this hospitalization (including imaging, microbiology, ancillary and laboratory) are listed below for reference.     Microbiology: Recent Results (from the past 240 hour(s))  Resp Panel by RT-PCR (Flu A&B, Covid) Nasopharyngeal Swab     Status: None   Collection Time: 11/30/20 12:36 PM   Specimen: Nasopharyngeal Swab; Nasopharyngeal(NP) swabs in vial transport medium  Result Value Ref Range Status   SARS Coronavirus 2 by RT PCR NEGATIVE NEGATIVE Final    Comment: (NOTE) SARS-CoV-2 target nucleic acids are NOT DETECTED.  The SARS-CoV-2 RNA is generally detectable in upper respiratory specimens during the acute phase of infection. The lowest concentration of SARS-CoV-2 viral copies this assay can detect is 138 copies/mL. A negative result does not preclude SARS-Cov-2 infection and should not be used as the sole basis for treatment or other patient management decisions. A negative result may occur with  improper specimen collection/handling, submission of specimen other than nasopharyngeal swab, presence of viral mutation(s) within  the areas targeted by this assay, and inadequate number of viral copies(<138 copies/mL). A negative result must be combined with clinical observations, patient history, and epidemiological information. The expected result is Negative.  Fact Sheet for Patients:  EntrepreneurPulse.com.au  Fact Sheet for Healthcare Providers:  IncredibleEmployment.be  This test is no t yet approved or cleared by the [place] FDA and  has been authorized for detection and/or diagnosis of SARS-CoV-2 by FDA under an Emergency Use Authorization (EUA). This EUA will remain  in effect (meaning this test can be used) for the duration of the COVID-19 declaration under Section 564(b)(1) of  the Act, 21 U.S.C.section 360bbb-3(b)(1), unless the authorization is terminated  or revoked sooner.       Influenza A by PCR NEGATIVE NEGATIVE Final   Influenza B by PCR NEGATIVE NEGATIVE Final    Comment: (NOTE) The Xpert Xpress SARS-CoV-2/FLU/RSV plus assay is intended as an aid in the diagnosis of influenza from Nasopharyngeal swab specimens and should not be used as a sole basis for treatment. Nasal washings and aspirates are unacceptable for Xpert Xpress SARS-CoV-2/FLU/RSV testing.  Fact Sheet for Patients: EntrepreneurPulse.com.au  Fact Sheet for Healthcare Providers: IncredibleEmployment.be  This test is not yet approved or cleared by the [place] FDA and has been authorized for detection and/or diagnosis of SARS-CoV-2 by FDA under an Emergency Use Authorization (EUA). This EUA will remain in effect (meaning this test can be used) for the duration of the COVID-19 declaration under Section 564(b)(1) of the Act, 21 U.S.C. section 360bbb-3(b)(1), unless the authorization is terminated or revoked.  Performed at [facility], [place] [address] [place], [place] [address]   Urine culture     Status: Abnormal   Collection Time:  11/30/20  1:07 PM   Specimen: Urine, Random  Result Value Ref Range Status   Specimen Description URINE, RANDOM  Final   Special Requests NONE  Final   Culture (A)  Final    <10,000 COLONIES/mL INSIGNIFICANT GROWTH Performed at [facility], [place] [address], [place], [place] [address]    Report Status 12/01/2020 FINAL  Final     Labs: BNP (last 3 results) Recent Labs    10/10/20 0712 11/30/20 1305  BNP 57.0 44.9   Basic Metabolic Panel: Recent Labs  Lab 11/30/20 1152 12/02/20 0333 12/03/20 0312 12/03/20 0645 12/04/20 0201  NA 140 138 138  --  136  K 3.6 4.1 5.2*  --  3.7  CL 94* 91* 91*  --  89*  CO2 39* 37* 32  --  39*  GLUCOSE 113* 188* 212*  --  204*  BUN 9 21 29*  --  33*  CREATININE 0.68 0.91 0.95  --  0.90  CALCIUM 8.7* 9.0 9.1  --  8.9  MG  --  1.6*  --  2.1  --    Liver Function Tests: Recent Labs  Lab 11/30/20 1305  AST 47*  ALT 29  ALKPHOS 44  BILITOT 1.0  PROT 7.4  ALBUMIN 3.4*   No results for input(s): LIPASE, AMYLASE in the last 168 hours. No results for input(s): AMMONIA in the last 168 hours. CBC: Recent Labs  Lab 11/30/20 1152  WBC 5.5  HGB 12.1  HCT 40.8  MCV 101.0*  PLT 180   Cardiac Enzymes: No results for input(s): CKTOTAL, CKMB, CKMBINDEX, TROPONINI in the last 168 hours. BNP: Invalid input(s): POCBNP CBG: Recent Labs  Lab 12/03/20 1713 12/03/20 2020 12/04/20 0607 12/04/20 0742 12/04/20 1154  GLUCAP 147* 222* 210* 196* 206*   D-Dimer No results for input(s): DDIMER in the last 72 hours. Hgb A1c No results for input(s): HGBA1C in the last 72 hours. Lipid Profile No results for input(s): CHOL, HDL, LDLCALC, TRIG, CHOLHDL, LDLDIRECT in the last 72 hours. Thyroid function studies No results for input(s): TSH, T4TOTAL, T3FREE, THYROIDAB in the last 72 hours.  Invalid input(s): FREET3 Anemia work up No results for input(s): VITAMINB12, FOLATE, FERRITIN, TIBC, IRON, RETICCTPCT in the last 72 hours. Urinalysis     Component Value Date/Time   COLORURINE YELLOW 11/30/2020 [place] 11/30/2020  1530   LABSPEC 1.008 11/30/2020 1530   PHURINE 6.0 11/30/2020 1530   GLUCOSEU NEGATIVE 11/30/2020 1530   HGBUR NEGATIVE 11/30/2020 [place] 11/30/2020 1530   KETONESUR 5 (A) 11/30/2020 1530   PROTEINUR 30 (A) 11/30/2020 1530   NITRITE NEGATIVE 11/30/2020 1530   LEUKOCYTESUR NEGATIVE 11/30/2020 1530   Sepsis Labs Invalid input(s): PROCALCITONIN,  WBC,  LACTICIDVEN Microbiology Recent Results (from the past 240 hour(s))  Resp Panel by RT-PCR (Flu A&B, Covid) Nasopharyngeal Swab     Status: None   Collection Time: 11/30/20 12:36 PM   Specimen: Nasopharyngeal Swab; Nasopharyngeal(NP) swabs in vial transport medium  Result Value Ref Range Status   SARS Coronavirus 2 by RT PCR NEGATIVE NEGATIVE Final    Comment: (NOTE) SARS-CoV-2 target nucleic acids are NOT DETECTED.  The SARS-CoV-2 RNA is generally detectable in upper respiratory specimens during the acute phase of infection. The lowest concentration of SARS-CoV-2 viral copies this assay can detect is 138 copies/mL. A negative result does not preclude SARS-Cov-2 infection and should not be used as the sole basis for treatment or other patient management decisions. A negative result may occur with  improper specimen collection/handling, submission of specimen other than nasopharyngeal swab, presence of viral mutation(s) within the areas targeted by this assay, and inadequate number of viral copies(<138 copies/mL). A negative result must be combined with clinical observations, patient history, and epidemiological information. The expected result is Negative.  Fact Sheet for Patients:  EntrepreneurPulse.com.au  Fact Sheet for Healthcare Providers:  IncredibleEmployment.be  This test is no t yet approved or cleared by the [place] FDA and  has been authorized for detection  and/or diagnosis of SARS-CoV-2 by FDA under an Emergency Use Authorization (EUA). This EUA will remain  in effect (meaning this test can be used) for the duration of the COVID-19 declaration under Section 564(b)(1) of the Act, 21 U.S.C.section 360bbb-3(b)(1), unless the authorization is terminated  or revoked sooner.       Influenza A by PCR NEGATIVE NEGATIVE Final   Influenza B by PCR NEGATIVE NEGATIVE Final    Comment: (NOTE) The Xpert Xpress SARS-CoV-2/FLU/RSV plus assay is intended as an aid in the diagnosis of influenza from Nasopharyngeal swab specimens and should not be used as a sole basis for treatment. Nasal washings and aspirates are unacceptable for Xpert Xpress SARS-CoV-2/FLU/RSV testing.  Fact Sheet for Patients: EntrepreneurPulse.com.au  Fact Sheet for Healthcare Providers: IncredibleEmployment.be  This test is not yet approved or cleared by the [place] FDA and has been authorized for detection and/or diagnosis of SARS-CoV-2 by FDA under an Emergency Use Authorization (EUA). This EUA will remain in effect (meaning this test can be used) for the duration of the COVID-19 declaration under Section 564(b)(1) of the Act, 21 U.S.C. section 360bbb-3(b)(1), unless the authorization is terminated or revoked.  Performed at [facility], [place] [address], [place], [place] [address]   Urine culture     Status: Abnormal   Collection Time: 11/30/20  1:07 PM   Specimen: Urine, Random  Result Value Ref Range Status   Specimen Description URINE, RANDOM  Final   Special Requests NONE  Final   Culture (A)  Final    <10,000 COLONIES/mL INSIGNIFICANT GROWTH Performed at [facility], [place] [address], [place], [place] [address]    Report Status 12/01/2020 FINAL  Final   Time spent: 30 min  SIGNED:   Marylu Lund, MD  [facility] 12/04/2020, 1:00 PM  If  7PM-7AM, please contact night-coverage

## 2020-12-04 NOTE — TOC Transition Note (Signed)
Transition of Care [facility]) - CM/SW Discharge Note   Patient Details  Name: Luvern Mischke MRN: [id number] Date of Birth: 1960-08-17  Transition of Care [facility]) CM/SW Contact:  Beckie Busing, RN Phone Number: [phone]  12/04/2020, 2:09 PM   Clinical Narrative:    1400 Meds delivered to the bedside per [facility] pharmacy. No other needs noted at this time. TOC will sign off.    Final next level of care: Home/Self Care Barriers to Discharge: No Barriers Identified   Patient Goals and CMS Choice Patient states their goals for this hospitalization and ongoing recovery are:: Ready to go home   Choice offered to / list presented to : NA  Discharge Placement                       Discharge Plan and Services In-house Referral: NA Discharge Planning Services: NA Post Acute Care Choice: NA          DME Arranged: N/A DME Agency: NA       HH Arranged: NA HH Agency: NA        Social Determinants of Health (SDOH) Interventions     Readmission Risk Interventions Readmission Risk Prevention Plan 12/04/2020  Transportation Screening Complete  PCP or Specialist Appt within 3-5 Days Complete  HRI or Home Care Consult Complete  Social Work Consult for Recovery Care Planning/Counseling Complete  Palliative Care Screening Not Applicable  Medication Review [facility]) Referral to Pharmacy

## 2020-12-04 NOTE — Progress Notes (Signed)
Physical Therapy Treatment Patient Details Name: Julia Henderson MRN: [id number] DOB: [birth year] Today's Date: 12/04/2020    History of Present Illness 60 y.o. female presented to the ED 11/30/20 for evaluation of shortness of breath and hypoxia. PMH-- COPD/chronic bronchitis, COVID 08/2020, chronic respiratory failure on 2 L home oxygen, hypertension, hyperlipidemia, insulin-dependent type 2 diabetes, morbid obesity    PT Comments    Patient on 3L on arrival with sats 91-94%. While walking on 3L sats at lowest 89% (generally 91-94%)  and pt with good use of pursed lip breathing for recovery. Addressed rt knee pain and safe use of RW and appropriate exercises (see below).      Follow Up Recommendations  No PT follow up     Equipment Recommendations  None recommended by PT    Recommendations for Other Services OT consult     Precautions / Restrictions Precautions Precautions: None Restrictions Weight Bearing Restrictions: No    Mobility  Bed Mobility               General bed mobility comments: up in chair    Transfers Overall transfer level: Modified independent Equipment used: Rolling walker (2 wheeled)             General transfer comment: From straight-backed chair  Ambulation/Gait Ambulation/Gait assistance: Modified independent (Device/Increase time) Gait Distance (Feet): 40 Feet (seated rest; 20) Assistive device: Rolling walker (2 wheeled) Gait Pattern/deviations: Step-through pattern;Antalgic Gait velocity: appropriately decr for CP status   General Gait Details: rt knee pain; educated on proximity to RW for support (one episode of buckling with pt self-recovering)   Stairs             Wheelchair Mobility    Modified Rankin (Stroke Patients Only)       Balance Overall balance assessment: Mild deficits observed, not formally tested                                          Cognition Arousal/Alertness:  Awake/alert Behavior During Therapy: WFL for tasks assessed/performed Overall Cognitive Status: Within Functional Limits for tasks assessed                                        Exercises Other Exercises Other Exercises: Educated on balance between strengthening Rt knee and not over-taxing it and increasing pain. Educated on mid-range LAQ or standing "marching" prior to activity to improve joint lubrication. Educated on sit to stand as good strengthening exercise.    General Comments General comments (skin integrity, edema, etc.): on 3L with sats 93% on arrival; during ambulation decr to 91% first/longer walk and 89% second walk. Using pursed lip breathing appropriately      Pertinent Vitals/Pain Pain Assessment: No/denies pain    Home Living                      Prior Function            PT Goals (current goals can now be found in the care plan section) Acute Rehab PT Goals Patient Stated Goal: go home Time For Goal Achievement: 12/10/20 Potential to Achieve Goals: Good Progress towards PT goals: Progressing toward goals    Frequency    Min 3X/week      PT Plan Current plan remains  appropriate    Co-evaluation              AM-PAC PT "6 Clicks" Mobility   Outcome Measure  Help needed turning from your back to your side while in a flat bed without using bedrails?: None Help needed moving from lying on your back to sitting on the side of a flat bed without using bedrails?: None Help needed moving to and from a bed to a chair (including a wheelchair)?: None Help needed standing up from a chair using your arms (e.g., wheelchair or bedside chair)?: None Help needed to walk in hospital room?: None Help needed climbing 3-5 steps with a railing? : A Little 6 Click Score: 23    End of Session Equipment Utilized During Treatment: Oxygen Activity Tolerance: Patient tolerated treatment well Patient left: in chair;with call bell/phone within  reach Nurse Communication: Mobility status PT Visit Diagnosis: Difficulty in walking, not elsewhere classified (R26.2)     Time: 5364-6803 PT Time Calculation (min) (ACUTE ONLY): 21 min  Charges:  $Therapeutic Exercise: 8-22 mins                      Jerolyn Center, PT Pager [phone]    Zena Amos 12/04/2020, 9:15 AM

## 2020-12-04 NOTE — TOC Initial Note (Addendum)
Transition of Care [facility]) - Initial/Assessment Note    Patient Details  Name: Julia Henderson MRN: [id number] Date of Birth: 08/28/1960  Transition of Care [facility]) CM/SW Contact:    Beckie Busing, RN Phone Number: [phone]  12/04/2020, 12:56 PM  Clinical Narrative:                 [facility] consulted for pateint with high risk for admission. Patient states that she is from home with her daughter and currently has no money. Patient states that she does have a PCP and follows up but currently does not have the money for meds. MATCH form to be completed for 30 day supply of meds. TOC will continue to follow for needs.     Expected Discharge Plan: Home/Self Care Barriers to Discharge: Continued Medical Work up   Patient Goals and CMS Choice Patient states their goals for this hospitalization and ongoing recovery are:: Ready to go home   Choice offered to / list presented to : NA  Expected Discharge Plan and Services Expected Discharge Plan: Home/Self Care In-house Referral: NA Discharge Planning Services: NA Post Acute Care Choice: NA Living arrangements for the past 2 months: Apartment                 DME Arranged: N/A DME Agency: NA       HH Arranged: NA HH Agency: NA        Prior Living Arrangements/Services Living arrangements for the past 2 months: Apartment Lives with:: Adult Children Patient language and need for interpreter reviewed:: Yes Do you feel safe going back to the place where you live?: Yes      Need for Family Participation in Patient Care: Yes (Comment) Care giver support system in place?: Yes (comment)   Criminal Activity/Legal Involvement Pertinent to Current Situation/Hospitalization: No - Comment as needed  Activities of Daily Living Home Assistive Devices/Equipment: Other (Comment) ADL Screening (condition at time of admission) Patient's cognitive ability adequate to safely complete daily activities?: Yes Is the patient deaf or have difficulty  hearing?: No Does the patient have difficulty seeing, even when wearing glasses/contacts?: No Does the patient have difficulty concentrating, remembering, or making decisions?: No Patient able to express need for assistance with ADLs?: Yes Does the patient have difficulty dressing or bathing?: No Independently performs ADLs?: Yes (appropriate for developmental age) Does the patient have difficulty walking or climbing stairs?: Yes Weakness of Legs: Both Weakness of Arms/Hands: Both  Permission Sought/Granted   Permission granted to share information with : No              Emotional Assessment Appearance:: Appears stated age   Affect (typically observed): Pleasant Orientation: : Oriented to Self,Oriented to Place,Oriented to  Time,Oriented to Situation Alcohol / Substance Use: Not Applicable Psych Involvement: No (comment)  Admission diagnosis:  Shortness of breath [R06.02] Wheezing [R06.2] Hypoxia [R09.02] COPD exacerbation (HCC) [J44.1] Patient Active Problem List   Diagnosis Date Noted  . COPD exacerbation (HCC) 11/30/2020  . Acute on chronic respiratory failure (HCC) 11/30/2020  . Pulmonary edema 11/30/2020  . Hypertensive urgency 11/30/2020  . Hyperlipidemia 11/30/2020  . Acute respiratory failure with hypoxia (HCC) 10/07/2020  . HTN (hypertension) 10/07/2020  . Morbid obesity (HCC) 10/07/2020  . DM2 (diabetes mellitus, type 2) (HCC) 10/07/2020   PCP:  Oneita Hurt, No Pharmacy:   [facility] [address] [place] - [address] [facility] [address] [place] [address] Phone: [phone] Fax: [phone]  [facility] [address] [place] [address] Phone: [phone] Fax: [phone]     Social Determinants of Health (SDOH) Interventions    Readmission Risk Interventions Readmission Risk Prevention Plan 12/04/2020  Transportation Screening Complete  PCP or Specialist Appt within 3-5 Days  Complete  HRI or Home Care Consult Complete  Social Work Consult for Recovery Care Planning/Counseling Complete  Palliative Care Screening Not Applicable  Medication Review [facility]) Referral to Pharmacy

## 2021-01-28 DIAGNOSIS — G894 Chronic pain syndrome: Secondary | ICD-10-CM | POA: Insufficient documentation

## 2021-01-28 DIAGNOSIS — J4551 Severe persistent asthma with (acute) exacerbation: Secondary | ICD-10-CM | POA: Insufficient documentation

## 2021-01-28 HISTORY — DX: Chronic pain syndrome: G89.4

## 2021-01-28 HISTORY — DX: Severe persistent asthma with (acute) exacerbation: J45.51

## 2021-02-12 ENCOUNTER — Institutional Professional Consult (permissible substitution): Payer: Medicaid Other | Admitting: Pulmonary Disease

## 2021-06-05 DIAGNOSIS — R5383 Other fatigue: Secondary | ICD-10-CM | POA: Insufficient documentation

## 2021-06-05 HISTORY — DX: Other fatigue: R53.83

## 2021-12-11 ENCOUNTER — Ambulatory Visit: Payer: Medicaid Other | Admitting: Podiatry

## 2022-01-07 ENCOUNTER — Ambulatory Visit: Payer: Medicaid Other | Admitting: Podiatrist

## 2022-01-07 ENCOUNTER — Encounter: Payer: Self-pay | Admitting: *Deleted

## 2022-01-07 ENCOUNTER — Other Ambulatory Visit: Payer: Self-pay | Admitting: *Deleted

## 2022-01-07 ENCOUNTER — Encounter: Payer: Self-pay | Admitting: Podiatrist

## 2022-01-07 DIAGNOSIS — G629 Polyneuropathy, unspecified: Secondary | ICD-10-CM | POA: Insufficient documentation

## 2022-01-07 DIAGNOSIS — M2141 Flat foot [pes planus] (acquired), right foot: Secondary | ICD-10-CM

## 2022-01-07 DIAGNOSIS — M2142 Flat foot [pes planus] (acquired), left foot: Secondary | ICD-10-CM

## 2022-01-07 DIAGNOSIS — I10 Essential (primary) hypertension: Secondary | ICD-10-CM | POA: Insufficient documentation

## 2022-01-07 DIAGNOSIS — J309 Allergic rhinitis, unspecified: Secondary | ICD-10-CM | POA: Insufficient documentation

## 2022-01-07 DIAGNOSIS — M216X9 Other acquired deformities of unspecified foot: Secondary | ICD-10-CM

## 2022-01-07 DIAGNOSIS — E119 Type 2 diabetes mellitus without complications: Secondary | ICD-10-CM

## 2022-01-07 HISTORY — DX: Essential (primary) hypertension: I10

## 2022-01-07 HISTORY — DX: Polyneuropathy, unspecified: G62.9

## 2022-01-07 NOTE — Patient Instructions (Signed)
Diabetes Mellitus and Foot Care Foot care is an important part of your health, especially when you have diabetes. Diabetes may cause you to have problems because of poor blood flow (circulation) to your feet and legs, which can cause your skin to: Become thinner and drier. Break more easily. Heal more slowly. Peel and crack. You may also have nerve damage (neuropathy) in your legs and feet, causing decreased feeling in them. This means that you may not notice minor injuries to your feet that could lead to more serious problems. Noticing and addressing any potential problems early is the best way to prevent future foot problems. How to care for your feet Foot hygiene  Wash your feet daily with warm water and mild soap. Do not use hot water. Then, pat your feet and the areas between your toes until they are completely dry. Do not soak your feet as this can dry your skin. Trim your toenails straight across. Do not dig under them or around the cuticle. File the edges of your nails with an emery board or nail file. Apply a moisturizing lotion or petroleum jelly to the skin on your feet and to dry, brittle toenails. Use lotion that does not contain alcohol and is unscented. Do not apply lotion between your toes. Shoes and socks Wear clean socks or stockings every day. Make sure they are not too tight. Do not wear knee-high stockings since they may decrease blood flow to your legs. Wear shoes that fit properly and have enough cushioning. Always look in your shoes before you put them on to be sure there are no objects inside. To break in new shoes, wear them for just a few hours a day. This prevents injuries on your feet. Wounds, scrapes, corns, and calluses  Check your feet daily for blisters, cuts, bruises, sores, and redness. If you cannot see the bottom of your feet, use a mirror or ask someone for help. Do not cut corns or calluses or try to remove them with medicine. If you find a minor scrape,  cut, or break in the skin on your feet, keep it and the skin around it clean and dry. You may clean these areas with mild soap and water. Do not clean the area with peroxide, alcohol, or iodine. If you have a wound, scrape, corn, or callus on your foot, look at it several times a day to make sure it is healing and not infected. Check for: Redness, swelling, or pain. Fluid or blood. Warmth. Pus or a bad smell. General tips Do not cross your legs. This may decrease blood flow to your feet. Do not use heating pads or hot water bottles on your feet. They may burn your skin. If you have lost feeling in your feet or legs, you may not know this is happening until it is too late. Protect your feet from hot and cold by wearing shoes, such as at the beach or on hot pavement. Schedule a complete foot exam at least once a year (annually) or more often if you have foot problems. Report any cuts, sores, or bruises to your health care provider immediately. Where to find more information American Diabetes Association: www.diabetes.org Association of Diabetes Care & Education Specialists: www.diabeteseducator.org Contact a health care provider if: You have a medical condition that increases your risk of infection and you have any cuts, sores, or bruises on your feet. You have an injury that is not healing. You have redness on your legs or feet. You   feel burning or tingling in your legs or feet. You have pain or cramps in your legs and feet. Your legs or feet are numb. Your feet always feel cold. You have pain around any toenails. Get help right away if: You have a wound, scrape, corn, or callus on your foot and: You have pain, swelling, or redness that gets worse. You have fluid or blood coming from the wound, scrape, corn, or callus. Your wound, scrape, corn, or callus feels warm to the touch. You have pus or a bad smell coming from the wound, scrape, corn, or callus. You have a fever. You have a red  line going up your leg. Summary Check your feet every day for blisters, cuts, bruises, sores, and redness. Apply a moisturizing lotion or petroleum jelly to the skin on your feet and to dry, brittle toenails. Wear shoes that fit properly and have enough cushioning. If you have foot problems, report any cuts, sores, or bruises to your health care provider immediately. Schedule a complete foot exam at least once a year (annually) or more often if you have foot problems. This information is not intended to replace advice given to you by your health care provider. Make sure you discuss any questions you have with your health care provider. Document Revised: 01/25/2020 Document Reviewed: 01/25/2020 Elsevier Patient Education  2023 Elsevier Inc.  

## 2022-01-07 NOTE — Progress Notes (Signed)
Chief Complaint  Patient presents with   Callouses    I have some spots on the balls of both feet      HPI: Patient is 61 y.o. female who presents today for painful hard lesions on the bottoms of both feet.  She relates she is diabeitc and her last a1c was 7.2.  she relates she has burning in her feet and swelling at times.  She has been diagnosed with neuropathy.    Patient Active Problem List   Diagnosis Date Noted   Allergic rhinitis 01/07/2022   Benign essential hypertension 01/07/2022   Neuropathy 01/07/2022   Other fatigue 06/05/2021   Chronic pain disorder 01/28/2021   Severe persistent asthma with (acute) exacerbation 01/28/2021   COPD exacerbation ([place]) 11/30/2020   Acute on chronic respiratory failure ([place]) 11/30/2020   Pulmonary edema 11/30/2020   Hypertensive urgency 11/30/2020   Hyperlipidemia 11/30/2020   Acute respiratory failure with hypoxia ([place]) 10/07/2020   HTN (hypertension) 10/07/2020   Morbid obesity ([place]) 10/07/2020   DM2 (diabetes mellitus, type 2) ([place]) 10/07/2020   Insomnia, unspecified 09/24/2020   Post covid-19 condition, unspecified 09/24/2020    Current Outpatient Medications on File Prior to Visit  Medication Sig Dispense Refill   albuterol (VENTOLIN HFA) 108 (90 Base) MCG/ACT inhaler INHALE 2 PUFFS INTO THE LUNGS EVERY 4 (FOUR) HOURS AS NEEDED FOR WHEEZING OR SHORTNESS OF BREATH. 18 g 0   aspirin EC 81 MG tablet Take 1 tablet (81 mg total) by mouth daily. Swallow whole. 30 tablet 0   blood glucose meter kit and supplies KIT Dispense based on patient and insurance preference. Use up to four times daily as directed. 1 each 0   Budeson-Glycopyrrol-Formoterol (BREZTRI AEROSPHERE IN) Inhale 2 puffs into the lungs daily.     cetirizine (ZYRTEC) 10 MG tablet Take 10 mg by mouth daily.     Cholecalciferol (VITAMIN D3) 125 MCG (5000 UT) TABS Take 1 tablet (5,000 Units total) by mouth daily. 30 tablet 0   gabapentin (NEURONTIN) 300 MG capsule Take 3  capsules (900 mg total) by mouth 3 (three) times daily. 270 capsule 0   hydrALAZINE (APRESOLINE) 25 MG tablet Take 1 tablet (25 mg total) by mouth every 8 (eight) hours. 90 tablet 0   insulin glargine (LANTUS) 100 UNIT/ML Solostar Pen INJECT 25 UNITS INTO THE SKIN DAILY. (Patient taking differently: Inject 25 Units into the skin daily.) 15 mL 1   Insulin Pen Needle 29G X 5MM MISC Use with insulin 100 each 0   metoprolol tartrate (LOPRESSOR) 100 MG tablet Take 1 tablet (100 mg total) by mouth 2 (two) times daily. 60 tablet 0   montelukast (SINGULAIR) 10 MG tablet Take 1 tablet (10 mg total) by mouth at bedtime. 30 tablet 0   Multiple Vitamin (MULTIVITAMIN) tablet Take 1 tablet by mouth daily.     omega-3 acid ethyl esters (LOVAZA) 1 g capsule TAKE 1 CAPSULE (1,000 MG TOTAL) BY MOUTH DAILY. (Patient taking differently: Take 1 g by mouth daily.) 30 capsule 0   Oxymetazoline HCl (12 HOUR NASAL DECONGESTANT NA) Place 1 spray into the nose daily as needed (congestion).     pantoprazole (PROTONIX) 40 MG tablet Take 1 tablet (40 mg total) by mouth daily at 6 (six) AM. 30 tablet 0   pravastatin (PRAVACHOL) 40 MG tablet Take 1 tablet (40 mg total) by mouth at bedtime. 30 tablet 0   traZODone (DESYREL) 100 MG tablet Take 1 tablet (100 mg total) by mouth at bedtime.  30 tablet 0   vitamin C (ASCORBIC ACID) 500 MG tablet Take 500 mg by mouth daily.     No current facility-administered medications on file prior to visit.    Allergies  Allergen Reactions   Simvastatin    Celexa [Citalopram]    Hydrocodone     Review of Systems No fevers, chills, nausea, muscle aches, no difficulty breathing, no calf pain, no chest pain or shortness of breath.   Physical Exam  GENERAL APPEARANCE: Alert, conversant. Appropriately groomed. No acute distress.   VASCULAR: Pedal pulses palpable 2/4 DP and 1/4 PT bilateral.  Capillary refill time is immediate to all digits,  Proximal to distal cooling it warm to warm.   Digital perfusion adequate.   NEUROLOGIC: sensation is intact to 5.07 monofilament at 2/5 sites bilateral.  Light touch is decreased bilateral, vibratory sensation is absent bilateral  MUSCULOSKELETAL: pes planus foot structure noted. acceptable muscle strength, tone and stability bilateral.  Mildly prominent fifth metatarsals are noted bilateral.   DERMATOLOGIC: skin is warm, supple, and dry.  Color, texture, and turgor of skin within normal limits.  Well circumscribed keratotic lesion s present submetatarsal 5 bilateral. Which are painful with ambulation.. intact integument is noted post debridement.      Assessment    ICD-10-CM   1. Neuropathy  G62.9     2. Prominent metatarsal head, unspecified laterality  M21.6X9     3. Pes planus of both feet  M21.41    M21.42     4. Type 2 diabetes mellitus without complication, without long-term current use of insulin (HCC)  E11.9        Plan  Exam findings discussed with the patient. Today I pared the lesions with a 15 blade and recommended padding.  Also recommended diabetic shoes due to her prominent fifth metatarsals and diabetes with neuropathy.  Will have her see Aaron Edelman in the future.

## 2022-02-03 DIAGNOSIS — Z0289 Encounter for other administrative examinations: Secondary | ICD-10-CM

## 2022-02-03 DIAGNOSIS — M47816 Spondylosis without myelopathy or radiculopathy, lumbar region: Secondary | ICD-10-CM

## 2022-02-03 DIAGNOSIS — M5136 Other intervertebral disc degeneration, lumbar region: Secondary | ICD-10-CM | POA: Insufficient documentation

## 2022-02-03 DIAGNOSIS — M51369 Other intervertebral disc degeneration, lumbar region without mention of lumbar back pain or lower extremity pain: Secondary | ICD-10-CM

## 2022-02-03 HISTORY — DX: Morbid (severe) obesity due to excess calories: E66.01

## 2022-02-03 HISTORY — DX: Spondylosis without myelopathy or radiculopathy, lumbar region: M47.816

## 2022-02-03 HISTORY — DX: Other intervertebral disc degeneration, lumbar region without mention of lumbar back pain or lower extremity pain: M51.369

## 2022-02-03 HISTORY — DX: Encounter for other administrative examinations: Z02.89

## 2022-02-11 ENCOUNTER — Other Ambulatory Visit: Payer: Medicaid Other

## 2022-03-04 DIAGNOSIS — Z79891 Long term (current) use of opiate analgesic: Secondary | ICD-10-CM

## 2022-03-04 HISTORY — DX: Long term (current) use of opiate analgesic: Z79.891

## 2022-03-20 ENCOUNTER — Other Ambulatory Visit: Payer: Medicaid Other

## 2022-04-22 ENCOUNTER — Telehealth: Payer: Self-pay | Admitting: Podiatry

## 2022-04-22 NOTE — Telephone Encounter (Signed)
Patient called she was to be measured for diabetic shoes back in June and she was schedule in July and September and both of those appointments were cancelled .  I added her to the waitlist for January not happy that she has to wait until New Year when we cancelled her appointments.

## 2022-07-22 ENCOUNTER — Other Ambulatory Visit: Payer: Medicare Other

## 2022-08-08 IMAGING — CR DG LUMBAR SPINE COMPLETE 4+V
6 series · 6 of 6 positions shown · non-contrast
Comparison: None.

CLINICAL DATA: Pain follow fall

EXAM:
LUMBAR SPINE - COMPLETE 4+ VIEW

[l-spine obl (1 of 2)]
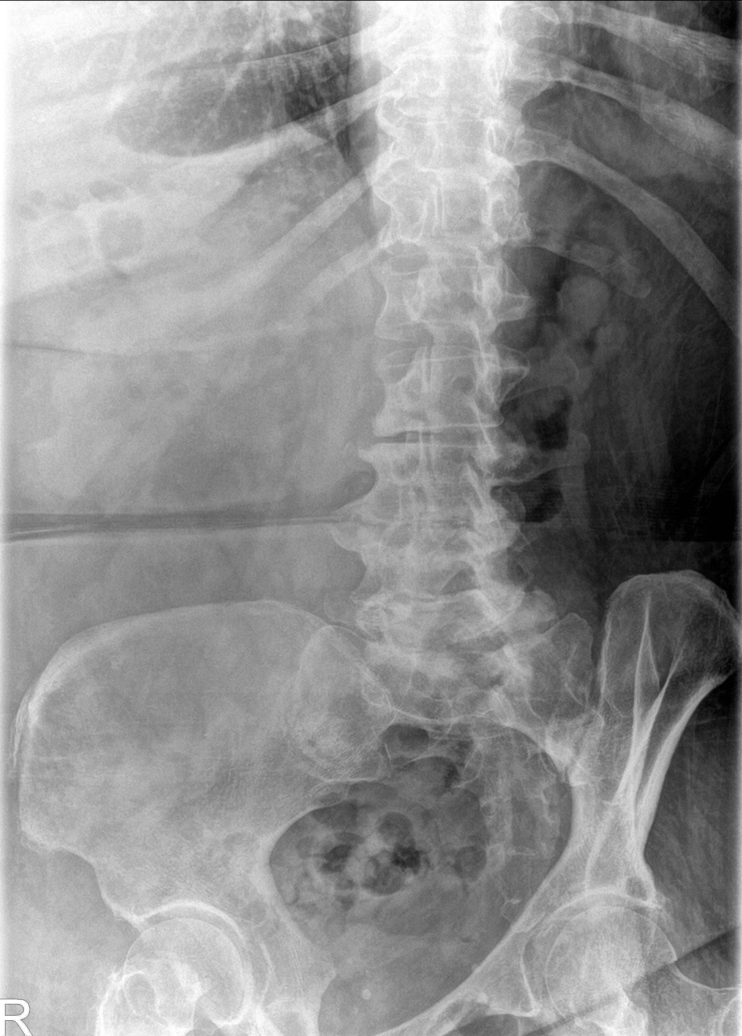

[l-spine lat]
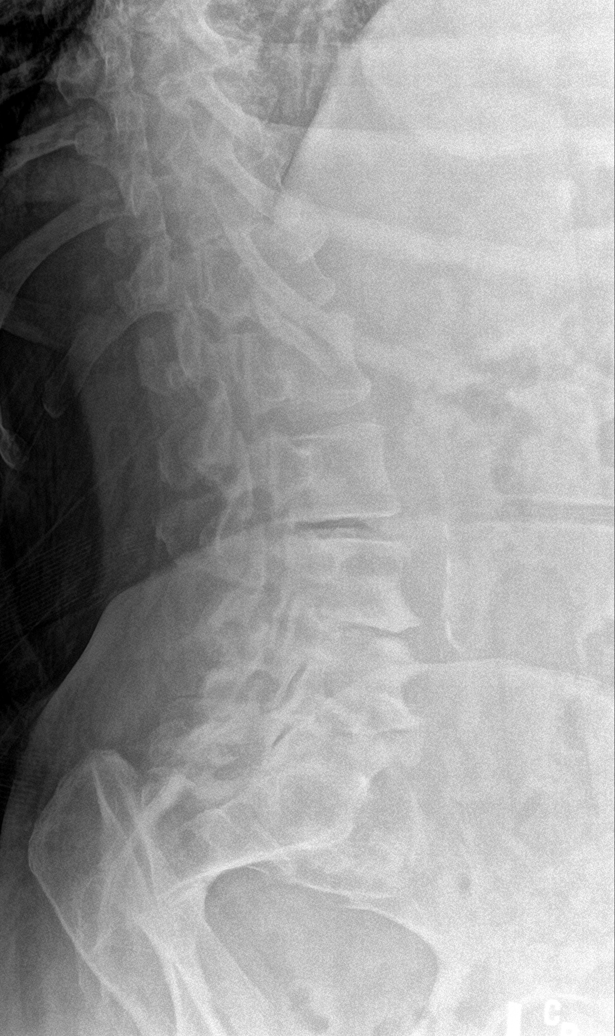

[l-spine ap (1 of 2)]
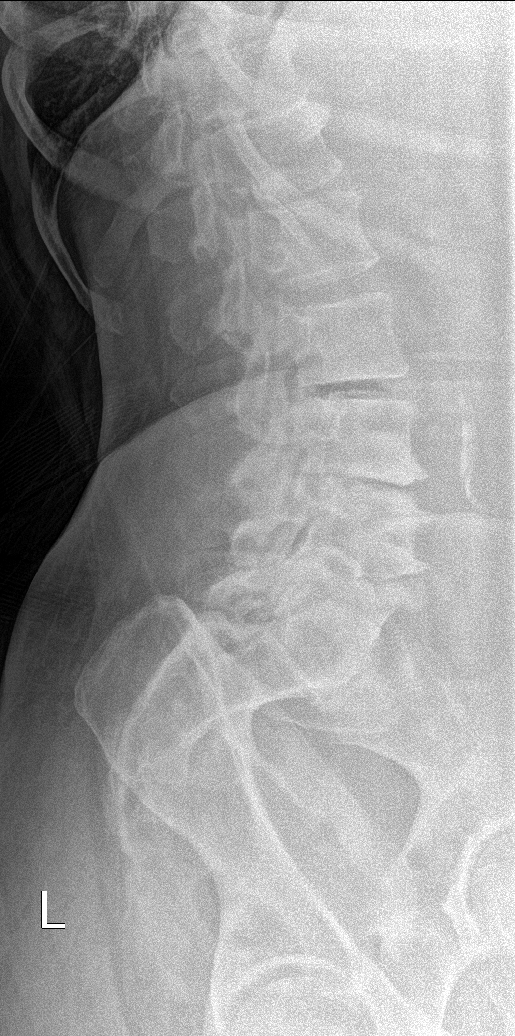

[l-spine ap (2 of 2)]
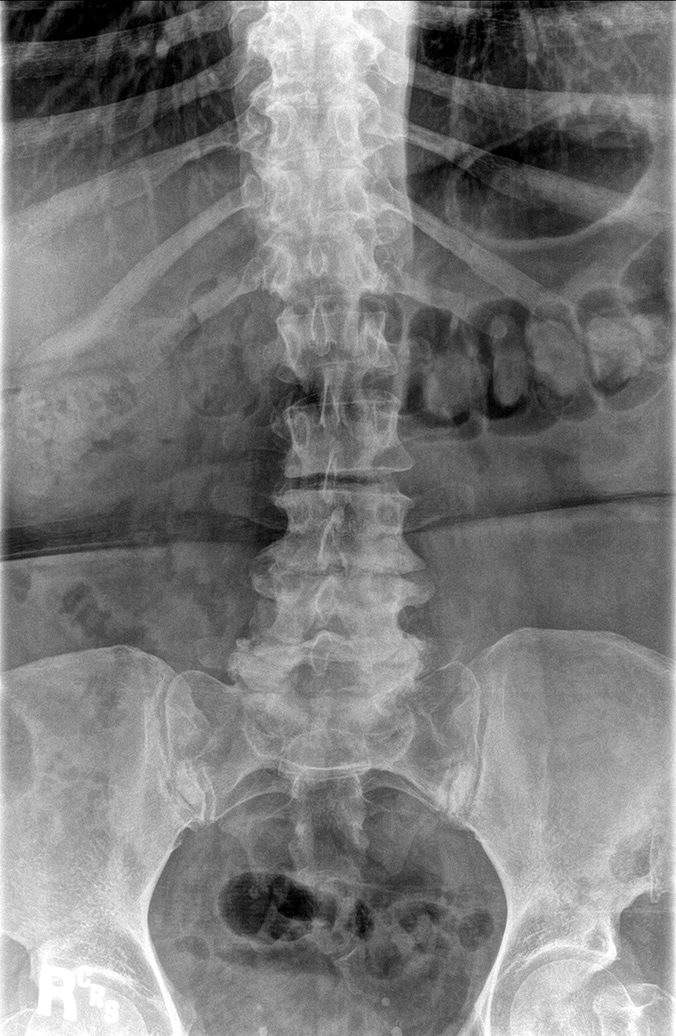

[l-spine obl (2 of 2)]
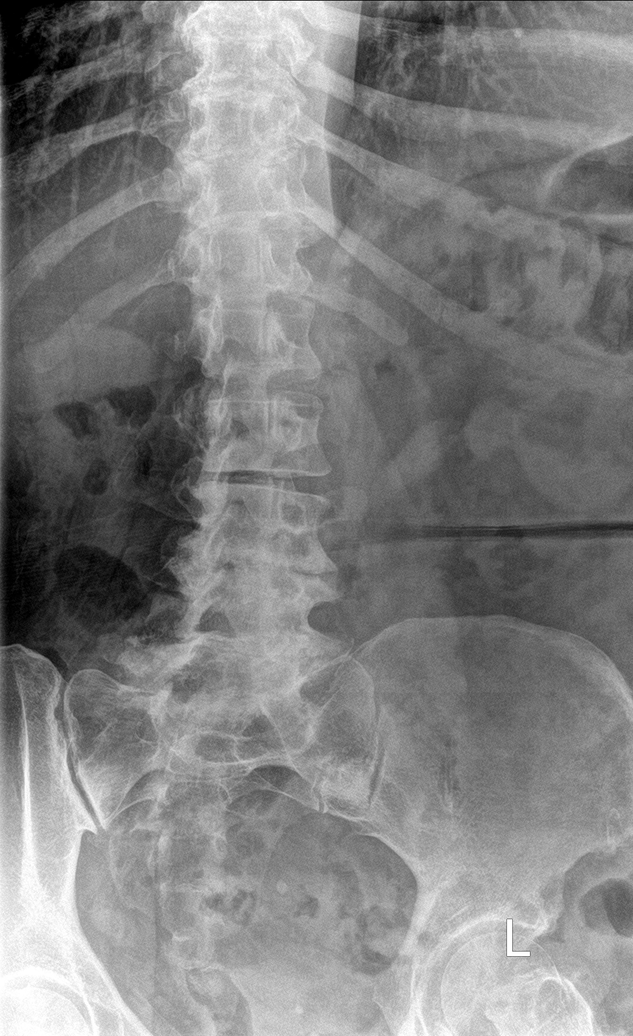

[l-spine spot]
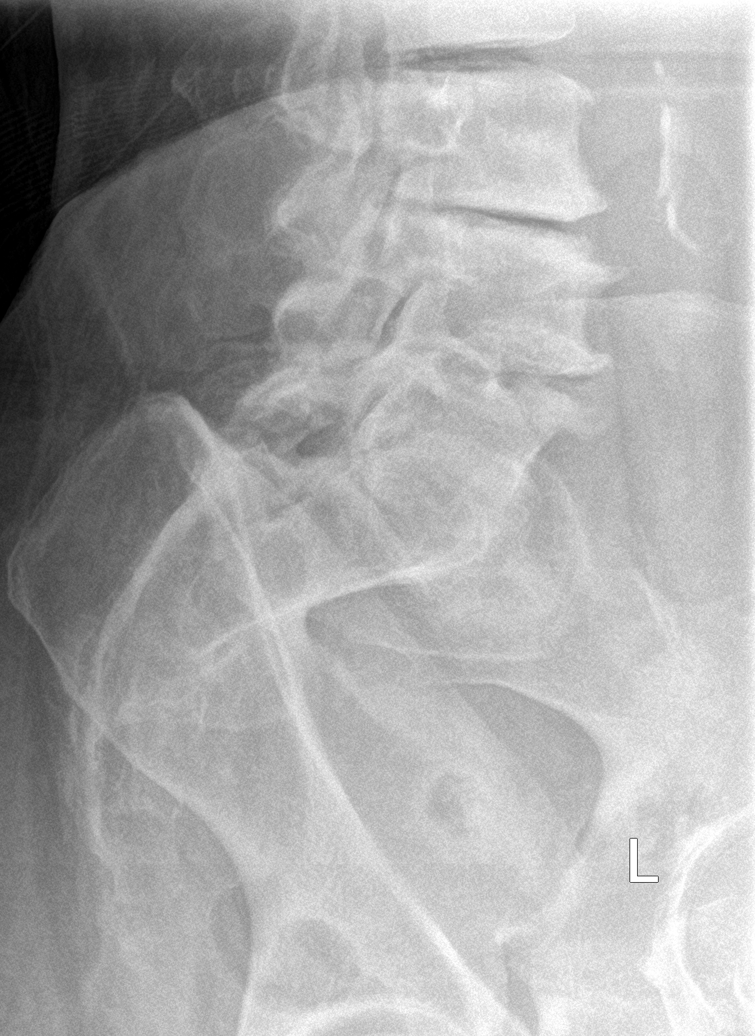

[6 of 6 positions shown; findings below may reference images not displayed]

FINDINGS: Frontal, lateral, spot lumbosacral lateral, and bilateral oblique
views were obtained. There are 5 non-rib-bearing lumbar type
vertebral bodies. There is no fracture or spondylolisthesis. There
is moderately severe disc space narrowing at L3-4, L4-5, and L5-S1.
There is facet osteoarthritic change at L3-4, L4-5, and L5-S1
bilaterally. There is aortic atherosclerosis.
IMPRESSION: No fracture or spondylolisthesis. Osteoarthritic change at L3-4,
L4-5, and L5-S1.

Aortic Atherosclerosis ([id number]).

## 2022-08-26 DIAGNOSIS — J3801 Paralysis of vocal cords and larynx, unilateral: Secondary | ICD-10-CM

## 2022-08-26 HISTORY — DX: Paralysis of vocal cords and larynx, unilateral: J38.01

## 2022-08-27 ENCOUNTER — Other Ambulatory Visit: Payer: Self-pay | Admitting: Otolaryngology

## 2022-08-27 DIAGNOSIS — J3801 Paralysis of vocal cords and larynx, unilateral: Secondary | ICD-10-CM

## 2022-09-21 DIAGNOSIS — Z713 Dietary counseling and surveillance: Secondary | ICD-10-CM | POA: Diagnosis not present

## 2022-09-21 DIAGNOSIS — Z6841 Body Mass Index (BMI) 40.0 and over, adult: Secondary | ICD-10-CM | POA: Diagnosis not present

## 2022-09-21 DIAGNOSIS — K219 Gastro-esophageal reflux disease without esophagitis: Secondary | ICD-10-CM | POA: Diagnosis not present

## 2022-09-21 DIAGNOSIS — J301 Allergic rhinitis due to pollen: Secondary | ICD-10-CM | POA: Diagnosis not present

## 2022-09-21 DIAGNOSIS — Z8616 Personal history of COVID-19: Secondary | ICD-10-CM | POA: Diagnosis not present

## 2022-09-21 DIAGNOSIS — E119 Type 2 diabetes mellitus without complications: Secondary | ICD-10-CM | POA: Diagnosis not present

## 2022-09-21 DIAGNOSIS — G4733 Obstructive sleep apnea (adult) (pediatric): Secondary | ICD-10-CM | POA: Diagnosis not present

## 2022-09-21 DIAGNOSIS — F341 Dysthymic disorder: Secondary | ICD-10-CM | POA: Diagnosis not present

## 2022-09-21 DIAGNOSIS — J441 Chronic obstructive pulmonary disease with (acute) exacerbation: Secondary | ICD-10-CM | POA: Diagnosis not present

## 2022-09-21 DIAGNOSIS — R5383 Other fatigue: Secondary | ICD-10-CM | POA: Diagnosis not present

## 2022-09-21 DIAGNOSIS — Z7182 Exercise counseling: Secondary | ICD-10-CM | POA: Diagnosis not present

## 2022-09-21 DIAGNOSIS — R498 Other voice and resonance disorders: Secondary | ICD-10-CM | POA: Diagnosis not present

## 2022-09-21 DIAGNOSIS — E785 Hyperlipidemia, unspecified: Secondary | ICD-10-CM | POA: Diagnosis not present

## 2022-09-21 DIAGNOSIS — J4540 Moderate persistent asthma, uncomplicated: Secondary | ICD-10-CM | POA: Diagnosis not present

## 2022-09-21 DIAGNOSIS — G894 Chronic pain syndrome: Secondary | ICD-10-CM | POA: Diagnosis not present

## 2022-09-21 DIAGNOSIS — J4551 Severe persistent asthma with (acute) exacerbation: Secondary | ICD-10-CM | POA: Diagnosis not present

## 2022-09-24 ENCOUNTER — Inpatient Hospital Stay: Admission: RE | Admit: 2022-09-24 | Payer: Medicare Other | Source: Ambulatory Visit

## 2022-09-25 DIAGNOSIS — J9600 Acute respiratory failure, unspecified whether with hypoxia or hypercapnia: Secondary | ICD-10-CM | POA: Diagnosis not present

## 2022-09-25 DIAGNOSIS — I10 Essential (primary) hypertension: Secondary | ICD-10-CM | POA: Diagnosis not present

## 2022-09-29 DIAGNOSIS — M79672 Pain in left foot: Secondary | ICD-10-CM | POA: Diagnosis not present

## 2022-09-29 DIAGNOSIS — E119 Type 2 diabetes mellitus without complications: Secondary | ICD-10-CM | POA: Diagnosis not present

## 2022-09-29 DIAGNOSIS — Z7952 Long term (current) use of systemic steroids: Secondary | ICD-10-CM | POA: Diagnosis not present

## 2022-09-29 DIAGNOSIS — M216X9 Other acquired deformities of unspecified foot: Secondary | ICD-10-CM | POA: Diagnosis not present

## 2022-09-29 DIAGNOSIS — L84 Corns and callosities: Secondary | ICD-10-CM | POA: Diagnosis not present

## 2022-09-29 DIAGNOSIS — M25572 Pain in left ankle and joints of left foot: Secondary | ICD-10-CM | POA: Diagnosis not present

## 2022-09-29 DIAGNOSIS — Z6841 Body Mass Index (BMI) 40.0 and over, adult: Secondary | ICD-10-CM | POA: Diagnosis not present

## 2022-09-29 DIAGNOSIS — M79671 Pain in right foot: Secondary | ICD-10-CM | POA: Diagnosis not present

## 2022-09-29 DIAGNOSIS — E11628 Type 2 diabetes mellitus with other skin complications: Secondary | ICD-10-CM | POA: Diagnosis not present

## 2022-09-29 DIAGNOSIS — M25571 Pain in right ankle and joints of right foot: Secondary | ICD-10-CM | POA: Diagnosis not present

## 2022-10-26 DIAGNOSIS — J9600 Acute respiratory failure, unspecified whether with hypoxia or hypercapnia: Secondary | ICD-10-CM | POA: Diagnosis not present

## 2022-10-26 DIAGNOSIS — I10 Essential (primary) hypertension: Secondary | ICD-10-CM | POA: Diagnosis not present

## 2022-10-28 DIAGNOSIS — M1712 Unilateral primary osteoarthritis, left knee: Secondary | ICD-10-CM | POA: Diagnosis not present

## 2022-10-28 DIAGNOSIS — M1711 Unilateral primary osteoarthritis, right knee: Secondary | ICD-10-CM | POA: Diagnosis not present

## 2022-10-29 DIAGNOSIS — S32010A Wedge compression fracture of first lumbar vertebra, initial encounter for closed fracture: Secondary | ICD-10-CM | POA: Diagnosis not present

## 2022-10-29 DIAGNOSIS — G629 Polyneuropathy, unspecified: Secondary | ICD-10-CM | POA: Diagnosis not present

## 2022-10-29 DIAGNOSIS — M47816 Spondylosis without myelopathy or radiculopathy, lumbar region: Secondary | ICD-10-CM | POA: Diagnosis not present

## 2022-10-29 DIAGNOSIS — Z1389 Encounter for screening for other disorder: Secondary | ICD-10-CM | POA: Diagnosis not present

## 2022-10-29 DIAGNOSIS — M549 Dorsalgia, unspecified: Secondary | ICD-10-CM | POA: Diagnosis not present

## 2022-10-29 DIAGNOSIS — M5136 Other intervertebral disc degeneration, lumbar region: Secondary | ICD-10-CM | POA: Diagnosis not present

## 2022-10-29 DIAGNOSIS — M170 Bilateral primary osteoarthritis of knee: Secondary | ICD-10-CM | POA: Diagnosis not present

## 2022-10-29 DIAGNOSIS — G894 Chronic pain syndrome: Secondary | ICD-10-CM | POA: Diagnosis not present

## 2022-10-30 DIAGNOSIS — M47816 Spondylosis without myelopathy or radiculopathy, lumbar region: Secondary | ICD-10-CM | POA: Diagnosis not present

## 2022-10-30 DIAGNOSIS — G4733 Obstructive sleep apnea (adult) (pediatric): Secondary | ICD-10-CM | POA: Diagnosis not present

## 2022-10-30 DIAGNOSIS — M545 Low back pain, unspecified: Secondary | ICD-10-CM | POA: Diagnosis not present

## 2022-11-02 ENCOUNTER — Ambulatory Visit
Admission: RE | Admit: 2022-11-02 | Discharge: 2022-11-02 | Disposition: A | Discharge status: Home / Self Care | Payer: Medicare HMO | Source: Ambulatory Visit | Attending: Otolaryngology | Admitting: Otolaryngology

## 2022-11-02 DIAGNOSIS — E041 Nontoxic single thyroid nodule: Secondary | ICD-10-CM | POA: Diagnosis not present

## 2022-11-02 DIAGNOSIS — J3801 Paralysis of vocal cords and larynx, unilateral: Secondary | ICD-10-CM

## 2022-11-02 DIAGNOSIS — K029 Dental caries, unspecified: Secondary | ICD-10-CM | POA: Diagnosis not present

## 2022-11-02 MED ORDER — IOPAMIDOL (ISOVUE-300) INJECTION 61%
75.0000 mL | Freq: Once | INTRAVENOUS | Status: AC | PRN
  Administered 2022-11-02: 75 mL via INTRAVENOUS

## 2022-11-04 DIAGNOSIS — M1711 Unilateral primary osteoarthritis, right knee: Secondary | ICD-10-CM | POA: Diagnosis not present

## 2022-11-06 DIAGNOSIS — R0600 Dyspnea, unspecified: Secondary | ICD-10-CM | POA: Diagnosis not present

## 2022-11-06 DIAGNOSIS — I700 Atherosclerosis of aorta: Secondary | ICD-10-CM | POA: Diagnosis not present

## 2022-11-06 DIAGNOSIS — R0602 Shortness of breath: Secondary | ICD-10-CM | POA: Diagnosis not present

## 2022-11-09 DIAGNOSIS — R5383 Other fatigue: Secondary | ICD-10-CM | POA: Diagnosis not present

## 2022-11-09 DIAGNOSIS — Z8616 Personal history of COVID-19: Secondary | ICD-10-CM | POA: Diagnosis not present

## 2022-11-09 DIAGNOSIS — G4733 Obstructive sleep apnea (adult) (pediatric): Secondary | ICD-10-CM | POA: Diagnosis not present

## 2022-11-09 DIAGNOSIS — R498 Other voice and resonance disorders: Secondary | ICD-10-CM | POA: Diagnosis not present

## 2022-11-09 DIAGNOSIS — J4540 Moderate persistent asthma, uncomplicated: Secondary | ICD-10-CM | POA: Diagnosis not present

## 2022-11-09 DIAGNOSIS — J301 Allergic rhinitis due to pollen: Secondary | ICD-10-CM | POA: Diagnosis not present

## 2022-11-11 DIAGNOSIS — M1711 Unilateral primary osteoarthritis, right knee: Secondary | ICD-10-CM | POA: Diagnosis not present

## 2022-11-16 DIAGNOSIS — M5459 Other low back pain: Secondary | ICD-10-CM | POA: Diagnosis not present

## 2022-11-23 DIAGNOSIS — M5459 Other low back pain: Secondary | ICD-10-CM | POA: Diagnosis not present

## 2022-11-25 DIAGNOSIS — J9600 Acute respiratory failure, unspecified whether with hypoxia or hypercapnia: Secondary | ICD-10-CM | POA: Diagnosis not present

## 2022-11-25 DIAGNOSIS — I10 Essential (primary) hypertension: Secondary | ICD-10-CM | POA: Diagnosis not present

## 2022-11-27 DIAGNOSIS — G629 Polyneuropathy, unspecified: Secondary | ICD-10-CM | POA: Diagnosis not present

## 2022-11-27 DIAGNOSIS — J301 Allergic rhinitis due to pollen: Secondary | ICD-10-CM | POA: Diagnosis not present

## 2022-11-27 DIAGNOSIS — M170 Bilateral primary osteoarthritis of knee: Secondary | ICD-10-CM | POA: Diagnosis not present

## 2022-11-27 DIAGNOSIS — J4541 Moderate persistent asthma with (acute) exacerbation: Secondary | ICD-10-CM | POA: Diagnosis not present

## 2022-11-27 DIAGNOSIS — Z8616 Personal history of COVID-19: Secondary | ICD-10-CM | POA: Diagnosis not present

## 2022-11-27 DIAGNOSIS — Z1389 Encounter for screening for other disorder: Secondary | ICD-10-CM | POA: Diagnosis not present

## 2022-11-27 DIAGNOSIS — M47816 Spondylosis without myelopathy or radiculopathy, lumbar region: Secondary | ICD-10-CM | POA: Diagnosis not present

## 2022-11-27 DIAGNOSIS — R5383 Other fatigue: Secondary | ICD-10-CM | POA: Diagnosis not present

## 2022-11-27 DIAGNOSIS — M5136 Other intervertebral disc degeneration, lumbar region: Secondary | ICD-10-CM | POA: Diagnosis not present

## 2022-11-27 DIAGNOSIS — G4733 Obstructive sleep apnea (adult) (pediatric): Secondary | ICD-10-CM | POA: Diagnosis not present

## 2022-11-27 DIAGNOSIS — G894 Chronic pain syndrome: Secondary | ICD-10-CM | POA: Diagnosis not present

## 2022-11-27 DIAGNOSIS — S32010A Wedge compression fracture of first lumbar vertebra, initial encounter for closed fracture: Secondary | ICD-10-CM | POA: Diagnosis not present

## 2022-11-27 DIAGNOSIS — R498 Other voice and resonance disorders: Secondary | ICD-10-CM | POA: Diagnosis not present

## 2022-12-07 DIAGNOSIS — R509 Fever, unspecified: Secondary | ICD-10-CM | POA: Diagnosis not present

## 2022-12-07 DIAGNOSIS — R059 Cough, unspecified: Secondary | ICD-10-CM | POA: Diagnosis not present

## 2022-12-07 DIAGNOSIS — R0602 Shortness of breath: Secondary | ICD-10-CM | POA: Diagnosis not present

## 2022-12-07 DIAGNOSIS — J45901 Unspecified asthma with (acute) exacerbation: Secondary | ICD-10-CM | POA: Diagnosis not present

## 2022-12-07 DIAGNOSIS — J449 Chronic obstructive pulmonary disease, unspecified: Secondary | ICD-10-CM | POA: Diagnosis not present

## 2022-12-07 DIAGNOSIS — E119 Type 2 diabetes mellitus without complications: Secondary | ICD-10-CM | POA: Diagnosis not present

## 2022-12-07 DIAGNOSIS — K219 Gastro-esophageal reflux disease without esophagitis: Secondary | ICD-10-CM | POA: Diagnosis not present

## 2022-12-15 DIAGNOSIS — G4733 Obstructive sleep apnea (adult) (pediatric): Secondary | ICD-10-CM | POA: Diagnosis not present

## 2022-12-15 DIAGNOSIS — Z8616 Personal history of COVID-19: Secondary | ICD-10-CM | POA: Diagnosis not present

## 2022-12-15 DIAGNOSIS — R498 Other voice and resonance disorders: Secondary | ICD-10-CM | POA: Diagnosis not present

## 2022-12-15 DIAGNOSIS — J301 Allergic rhinitis due to pollen: Secondary | ICD-10-CM | POA: Diagnosis not present

## 2022-12-15 DIAGNOSIS — R5383 Other fatigue: Secondary | ICD-10-CM | POA: Diagnosis not present

## 2022-12-15 DIAGNOSIS — J4551 Severe persistent asthma with (acute) exacerbation: Secondary | ICD-10-CM | POA: Diagnosis not present

## 2022-12-16 DIAGNOSIS — J301 Allergic rhinitis due to pollen: Secondary | ICD-10-CM | POA: Diagnosis not present

## 2022-12-16 DIAGNOSIS — R498 Other voice and resonance disorders: Secondary | ICD-10-CM | POA: Diagnosis not present

## 2022-12-16 DIAGNOSIS — R5383 Other fatigue: Secondary | ICD-10-CM | POA: Diagnosis not present

## 2022-12-16 DIAGNOSIS — Z8616 Personal history of COVID-19: Secondary | ICD-10-CM | POA: Diagnosis not present

## 2022-12-16 DIAGNOSIS — G4733 Obstructive sleep apnea (adult) (pediatric): Secondary | ICD-10-CM | POA: Diagnosis not present

## 2022-12-16 DIAGNOSIS — J4551 Severe persistent asthma with (acute) exacerbation: Secondary | ICD-10-CM | POA: Diagnosis not present

## 2022-12-18 DIAGNOSIS — R051 Acute cough: Secondary | ICD-10-CM | POA: Diagnosis not present

## 2022-12-22 DIAGNOSIS — E119 Type 2 diabetes mellitus without complications: Secondary | ICD-10-CM | POA: Diagnosis not present

## 2022-12-22 DIAGNOSIS — L603 Nail dystrophy: Secondary | ICD-10-CM | POA: Diagnosis not present

## 2022-12-22 DIAGNOSIS — Z6841 Body Mass Index (BMI) 40.0 and over, adult: Secondary | ICD-10-CM | POA: Diagnosis not present

## 2022-12-23 DIAGNOSIS — G894 Chronic pain syndrome: Secondary | ICD-10-CM | POA: Diagnosis not present

## 2022-12-23 DIAGNOSIS — S32010A Wedge compression fracture of first lumbar vertebra, initial encounter for closed fracture: Secondary | ICD-10-CM | POA: Diagnosis not present

## 2022-12-23 DIAGNOSIS — G629 Polyneuropathy, unspecified: Secondary | ICD-10-CM | POA: Diagnosis not present

## 2022-12-23 DIAGNOSIS — M170 Bilateral primary osteoarthritis of knee: Secondary | ICD-10-CM | POA: Diagnosis not present

## 2022-12-23 DIAGNOSIS — Z1389 Encounter for screening for other disorder: Secondary | ICD-10-CM | POA: Diagnosis not present

## 2022-12-23 DIAGNOSIS — M5136 Other intervertebral disc degeneration, lumbar region: Secondary | ICD-10-CM | POA: Diagnosis not present

## 2022-12-23 DIAGNOSIS — M47816 Spondylosis without myelopathy or radiculopathy, lumbar region: Secondary | ICD-10-CM | POA: Diagnosis not present

## 2022-12-26 DIAGNOSIS — I10 Essential (primary) hypertension: Secondary | ICD-10-CM | POA: Diagnosis not present

## 2022-12-26 DIAGNOSIS — J9600 Acute respiratory failure, unspecified whether with hypoxia or hypercapnia: Secondary | ICD-10-CM | POA: Diagnosis not present

## 2023-01-01 DIAGNOSIS — Z8616 Personal history of COVID-19: Secondary | ICD-10-CM | POA: Diagnosis not present

## 2023-01-01 DIAGNOSIS — J4550 Severe persistent asthma, uncomplicated: Secondary | ICD-10-CM | POA: Diagnosis not present

## 2023-01-01 DIAGNOSIS — G4733 Obstructive sleep apnea (adult) (pediatric): Secondary | ICD-10-CM | POA: Diagnosis not present

## 2023-01-01 DIAGNOSIS — R498 Other voice and resonance disorders: Secondary | ICD-10-CM | POA: Diagnosis not present

## 2023-01-01 DIAGNOSIS — J301 Allergic rhinitis due to pollen: Secondary | ICD-10-CM | POA: Diagnosis not present

## 2023-01-01 DIAGNOSIS — R5383 Other fatigue: Secondary | ICD-10-CM | POA: Diagnosis not present

## 2023-01-11 DIAGNOSIS — R0602 Shortness of breath: Secondary | ICD-10-CM | POA: Diagnosis not present

## 2023-01-25 DIAGNOSIS — J9600 Acute respiratory failure, unspecified whether with hypoxia or hypercapnia: Secondary | ICD-10-CM | POA: Diagnosis not present

## 2023-01-25 DIAGNOSIS — I10 Essential (primary) hypertension: Secondary | ICD-10-CM | POA: Diagnosis not present

## 2023-02-09 DIAGNOSIS — R498 Other voice and resonance disorders: Secondary | ICD-10-CM | POA: Diagnosis not present

## 2023-02-09 DIAGNOSIS — J4550 Severe persistent asthma, uncomplicated: Secondary | ICD-10-CM | POA: Diagnosis not present

## 2023-02-09 DIAGNOSIS — R5383 Other fatigue: Secondary | ICD-10-CM | POA: Diagnosis not present

## 2023-02-09 DIAGNOSIS — G4733 Obstructive sleep apnea (adult) (pediatric): Secondary | ICD-10-CM | POA: Diagnosis not present

## 2023-02-09 DIAGNOSIS — J301 Allergic rhinitis due to pollen: Secondary | ICD-10-CM | POA: Diagnosis not present

## 2023-02-09 DIAGNOSIS — Z8616 Personal history of COVID-19: Secondary | ICD-10-CM | POA: Diagnosis not present

## 2023-02-12 ENCOUNTER — Other Ambulatory Visit: Payer: Self-pay

## 2023-02-12 DIAGNOSIS — J40 Bronchitis, not specified as acute or chronic: Secondary | ICD-10-CM | POA: Insufficient documentation

## 2023-02-12 DIAGNOSIS — R490 Dysphonia: Secondary | ICD-10-CM | POA: Insufficient documentation

## 2023-02-12 DIAGNOSIS — I10 Essential (primary) hypertension: Secondary | ICD-10-CM | POA: Insufficient documentation

## 2023-02-12 DIAGNOSIS — J4550 Severe persistent asthma, uncomplicated: Secondary | ICD-10-CM | POA: Insufficient documentation

## 2023-02-12 DIAGNOSIS — E662 Morbid (severe) obesity with alveolar hypoventilation: Secondary | ICD-10-CM | POA: Insufficient documentation

## 2023-02-12 DIAGNOSIS — E7800 Pure hypercholesterolemia, unspecified: Secondary | ICD-10-CM | POA: Insufficient documentation

## 2023-02-12 DIAGNOSIS — Z8616 Personal history of COVID-19: Secondary | ICD-10-CM | POA: Insufficient documentation

## 2023-02-12 DIAGNOSIS — G4733 Obstructive sleep apnea (adult) (pediatric): Secondary | ICD-10-CM | POA: Insufficient documentation

## 2023-02-18 DIAGNOSIS — M1711 Unilateral primary osteoarthritis, right knee: Secondary | ICD-10-CM | POA: Diagnosis not present

## 2023-02-25 DIAGNOSIS — I10 Essential (primary) hypertension: Secondary | ICD-10-CM | POA: Diagnosis not present

## 2023-02-25 DIAGNOSIS — J9600 Acute respiratory failure, unspecified whether with hypoxia or hypercapnia: Secondary | ICD-10-CM | POA: Diagnosis not present

## 2023-03-01 DIAGNOSIS — G629 Polyneuropathy, unspecified: Secondary | ICD-10-CM | POA: Diagnosis not present

## 2023-03-01 DIAGNOSIS — M47816 Spondylosis without myelopathy or radiculopathy, lumbar region: Secondary | ICD-10-CM | POA: Diagnosis not present

## 2023-03-01 DIAGNOSIS — S32010A Wedge compression fracture of first lumbar vertebra, initial encounter for closed fracture: Secondary | ICD-10-CM | POA: Diagnosis not present

## 2023-03-01 DIAGNOSIS — M5136 Other intervertebral disc degeneration, lumbar region: Secondary | ICD-10-CM | POA: Diagnosis not present

## 2023-03-01 DIAGNOSIS — M170 Bilateral primary osteoarthritis of knee: Secondary | ICD-10-CM | POA: Diagnosis not present

## 2023-03-01 DIAGNOSIS — G894 Chronic pain syndrome: Secondary | ICD-10-CM | POA: Diagnosis not present

## 2023-03-04 ENCOUNTER — Encounter: Payer: Self-pay | Admitting: Cardiology

## 2023-03-04 ENCOUNTER — Ambulatory Visit: Payer: Medicare HMO | Attending: Cardiology | Admitting: Cardiology

## 2023-03-04 VITALS — BP 138/82 | HR 82 | Ht 60.0 in | Wt 241.8 lb

## 2023-03-04 DIAGNOSIS — I2510 Atherosclerotic heart disease of native coronary artery without angina pectoris: Secondary | ICD-10-CM | POA: Insufficient documentation

## 2023-03-04 DIAGNOSIS — E119 Type 2 diabetes mellitus without complications: Secondary | ICD-10-CM

## 2023-03-04 DIAGNOSIS — E7800 Pure hypercholesterolemia, unspecified: Secondary | ICD-10-CM | POA: Diagnosis not present

## 2023-03-04 DIAGNOSIS — G4733 Obstructive sleep apnea (adult) (pediatric): Secondary | ICD-10-CM

## 2023-03-04 DIAGNOSIS — Z794 Long term (current) use of insulin: Secondary | ICD-10-CM | POA: Diagnosis not present

## 2023-03-04 DIAGNOSIS — I10 Essential (primary) hypertension: Secondary | ICD-10-CM

## 2023-03-04 DIAGNOSIS — E662 Morbid (severe) obesity with alveolar hypoventilation: Secondary | ICD-10-CM

## 2023-03-04 HISTORY — DX: Atherosclerotic heart disease of native coronary artery without angina pectoris: I25.10

## 2023-03-04 NOTE — Progress Notes (Signed)
Cardiology Office Note:    Date:  03/04/2023   ID:  Julia Henderson, DOB 04/12/1961, MRN [id number]  PCP:  [place], Sarah T, MD  Cardiologist:  Garwin Brothers, MD   Referring MD: Marcellus Scott, MD    ASSESSMENT:    1. Type 2 diabetes mellitus without complication, without long-term current use of insulin (HCC)   2. Hypertension, unspecified type   3. Benign essential hypertension   4. 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 OSA (obstructive sleep apnea)   6. High cholesterol   7. Coronary artery calcification seen on CT scan    PLAN:    In order of problems listed above:  Coronary artery calcification: Secondary prevention stressed to the patient.  Importance of compliance with diet medication stressed and she vocalized understanding.  Currently she mentions to me that she is asymptomatic.  But she does not do much.  Therefore I recommended Lexiscan sestamibi but she is not keen on it.  If she changes her mind she will let us know [place]. Mild to moderate mitral regurgitation: Stable at this time.  Blood pressure is stable and we will monitor this.  Annual echoes. Diabetes mellitus, mixed dyslipidemia and morbid obesity: Lifestyle modifications stressed weight reduction as time.  She promises to do better. Obstructive sleep apnea: Sleep health issues were discussed. Patient will be seen in follow-up appointment in 6 months or earlier if the patient has any concerns.    Medication Adjustments/Labs and Tests Ordered: Current medicines are reviewed at length with the patient today.  Concerns regarding medicines are outlined above.  Orders Placed This Encounter  Procedures   EKG 12-Lead   No orders of the defined types were placed in this encounter.    History of Present Illness:    Julia Henderson is a 62 y.o. female who is being seen today for the evaluation of mild to moderate mitral regurgitation at the request of Marcellus Scott, MD. patient is a  pleasant 62 year old female.  She has past medical history of essential hypertension, dyslipidemia, COPD, diabetes mellitus and morbid obesity.  She ambulates with a cane.  She tells me that she has knee issues.  For which she uses a cane but does not ambulate much.  She denies any chest pain orthopnea or PND.  She has coronary artery calcification on CT scan.  Syncope collapse  Past Medical History:  Diagnosis Date   Acute on chronic respiratory failure (HCC) 11/30/2020   Acute respiratory failure with hypoxia (HCC) 10/07/2020   Allergic rhinitis    Benign essential hypertension 01/07/2022   Bronchitis    Chronic pain disorder 01/28/2021   Chronic use of opiate for therapeutic purpose 03/04/2022   Last Assessment & Plan:    Patient and I have discussed the hazardous effects of continued opiate pain medication usage. Risks and benefits of above medications including but not limited to possibility of hyperalgesia, respiratory depression, sedation, and even death were discussed with the patient who expressed an understanding. Patient is willing to assume these health risks as mentioned above    COPD exacerbation (HCC) 11/30/2020   DDD (degenerative disc disease), lumbar 02/03/2022   Last Assessment & Plan:    See spondylosis plan.     DM2 (diabetes mellitus, type 2) (HCC) 10/07/2020   High cholesterol    Hoarseness    HTN (hypertension) 10/07/2020   Hyperlipidemia 11/30/2020   Hypertension    Hypertensive urgency 11/30/2020   Insomnia, unspecified 09/24/2020   Morbid (  severe) obesity with alveolar hypoventilation (HCC)    Morbid obesity (HCC) 10/07/2020   Morbid obesity with BMI of 45.0-49.9, adult (HCC) 02/03/2022   Last Assessment & Plan:    Patient educated about the detrimental effects of weight as it specifically pertains to pain management and overall health.  Patient's BMI 46.10 Encouraged healthy eating habits and routine low-impact cardiovascular exercises as tolerated.  Patient  reports recent dietary changes and increased low-impact cardiovascular exercises which has resulted in an approximate 20-25    Neuropathy 01/07/2022   OSA (obstructive sleep apnea)    Other fatigue 06/05/2021   Pain medication agreement 02/03/2022   Last Assessment & Plan:    Agreement reviewed and discussed, signed and placed in patient's chart today.       UDS to be collected in compliance with clinic policies and procedures.        Patient did not display any signs of abuse or diversion and has been checked on the [facility] Controlled Substance website.     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      Review of previous lumbar spine plain films completed in 2021 detailed in HPI.  She has been working on physician directed exercises including seated lower back stretches, upper trunk rot    Past Surgical History:  Procedure Laterality Date   CESAREAN SECTION      Current Medications: Current Meds  Medication Sig   albuterol (PROVENTIL) (2.5 MG/3ML) 0.083% nebulizer solution Take 2.5 mg by nebulization every 6 (six) hours as needed for wheezing or shortness of breath.   albuterol (VENTOLIN HFA) 108 (90 Base) MCG/ACT inhaler Inhale 1-2 puffs into the lungs every 6 (six) hours as needed for wheezing or shortness of breath.   blood glucose meter kit and supplies KIT Dispense based on patient and insurance preference. Use up to four times daily as directed.   Blood Glucose Monitoring Suppl (PRECISION XTRA) DEVI Dispense based on patient  and insurance preference. Use up to four times daily as directed.   cetirizine (ZYRTEC) 10 MG tablet Take 10 mg by mouth daily.   Cholecalciferol (VITAMIN D3) 125 MCG (5000 UT) TABS Take 1 tablet (5,000 Units total) by mouth daily.   DUPIXENT 300 MG/2ML SOPN Inject 2 mLs into the skin every 14 (fourteen) days.   Fluticasone-Umeclidin-Vilant (TRELEGY ELLIPTA) 100-62.5-25 MCG/ACT AEPB Take 1 puff(s) inhaled once a day   gabapentin (NEURONTIN) 300 MG capsule Take 3 capsules (900 mg total) by mouth 3 (three) times daily. (Patient taking differently: Take 900 mg by mouth 2 (two) times daily. Takes 300 mg by mouth at bedtime)   hydrALAZINE (APRESOLINE) 25 MG tablet Take 25 mg by mouth every 8 (eight) hours.   insulin glargine (LANTUS SOLOSTAR) 100 UNIT/ML Solostar Pen Inject 28 Units into the skin daily.   Insulin Pen Needle 29G X MISC Use with insulin   metoprolol tartrate (LOPRESSOR) 100 MG tablet Take 1 tablet (100 mg total) by mouth 2 (two) times daily.   montelukast (SINGULAIR) 10 MG tablet Take 1 tablet (10  mg total) by mouth at bedtime.   Multiple Vitamin (MULTIVITAMIN) tablet Take 1 tablet by mouth daily.   omega-3 acid ethyl esters (LOVAZA) 1 g capsule Take 1 g by mouth daily.   OZEMPIC, 0.25 OR 0.5 MG/DOSE, 2 MG/3ML SOPN Inject 0.25 mg into the skin once a week.   pantoprazole (PROTONIX) 40 MG tablet Take 1 tablet (40 mg total) by mouth daily at 6 (six) AM.   pravastatin (PRAVACHOL) 40 MG tablet Take 1 tablet (40 mg total) by mouth at bedtime.   traZODone (DESYREL) 100 MG tablet Take 1 tablet (100 mg total) by mouth at bedtime.   venlafaxine (EFFEXOR) 75 MG tablet Take 75 mg by mouth daily.   vitamin C (ASCORBIC ACID) 500 MG tablet Take 500 mg by mouth daily.   ZTLIDO 1.8 % PTCH Apply 1 patch topically 3 (three) times daily.     Allergies:   Simvastatin and Celexa [citalopram]   Social History   Socioeconomic History   Marital status: Single    Spouse name: Not on file   Number  of children: Not on file   Years of education: Not on file   Highest education level: Not on file  Occupational History   Not on file  Tobacco Use   Smoking status: Never   Smokeless tobacco: Never  Substance and Sexual Activity   Alcohol use: Yes   Drug use: Not Currently   Sexual activity: Not on file  Other Topics Concern   Not on file  Social History Narrative   Not on file   Social Determinants of Health   Financial Resource Strain: Not on File (11/13/2021)   Received from [employer]    Financial Resource Strain: 0  Food Insecurity: Not on File (11/13/2021)   Received from [facility] Insecurity    Food: 0  Transportation Needs: Not on File (11/13/2021)   Received from [employer] Needs    Transportation: 0  Physical Activity: Not on File (11/13/2021)   Received from [facility]   Physical Activity    Physical Activity: 0  Recent Concern: Physical Activity - At Risk (11/13/2021)   Received from [facility]   Physical Activity    Physical Activity: 2  Stress: Not on File (11/13/2021)   Received from [facility]   Stress    Stress: 0  Social Connections: Not at Risk (11/13/2021)   Received from [place], [place]   Social Connections    Social Connections and Isolation: 1     Family History: The patient's family history includes Heart disease in her father; Rheumatic fever in her mother. There is no history of Lung disease.  ROS:   Please see the history of present illness.    All other systems reviewed and are negative.  EKGs/Labs/Other Studies Reviewed:    The following studies were reviewed today:  EKG Interpretation Date/Time:  Thursday March 04 2023 14:50:52 EDT Ventricular Rate:  82 PR Interval:  176 QRS Duration:  82 QT Interval:  372 QTC Calculation: 434 R Axis:   89  Text Interpretation: Normal sinus rhythm Cannot rule out Anterior infarct (cited on or before 30-Nov-2020) Abnormal ECG When compared with ECG of 30-Nov-2020 11:54, No  significant change was found Confirmed by Belva Crome [phone]) on 03/04/2023 3:16:04 PM     Recent Labs: No results found for requested labs within last 365 days.  Recent Lipid Panel No results found for: "CHOL", "TRIG", "HDL", "CHOLHDL", "VLDL", "LDLCALC", "LDLDIRECT"  Physical Exam:    VS:  BP 138/82   Pulse 82   Ht 5' (1.524 m)   Wt 241 lb 12.8 oz (109.7 kg)   SpO2 97%   BMI 47.22 kg/m     Wt Readings from Last 3 Encounters:  03/04/23 241 lb 12.8 oz (109.7 kg)  10/07/20 268 lb 8 oz (121.8 kg)  09/13/20 250 lb (113.4 kg)     GEN: Patient is in no acute distress HEENT: Normal NECK: No JVD; No carotid bruits LYMPHATICS: No lymphadenopathy CARDIAC: S1 S2 regular, 2/6 systolic murmur at the apex. RESPIRATORY:  Clear to auscultation without rales, wheezing or rhonchi  ABDOMEN: Soft, non-tender, non-distended MUSCULOSKELETAL:  No edema; No deformity  SKIN: Warm and dry NEUROLOGIC:  Alert and oriented x 3 PSYCHIATRIC:  Normal affect    Signed, Garwin Brothers, MD  03/04/2023 3:25 PM    [facility]

## 2023-03-04 NOTE — Patient Instructions (Signed)

## 2023-03-16 DIAGNOSIS — J4550 Severe persistent asthma, uncomplicated: Secondary | ICD-10-CM | POA: Diagnosis not present

## 2023-03-16 DIAGNOSIS — G4733 Obstructive sleep apnea (adult) (pediatric): Secondary | ICD-10-CM | POA: Diagnosis not present

## 2023-03-16 DIAGNOSIS — R498 Other voice and resonance disorders: Secondary | ICD-10-CM | POA: Diagnosis not present

## 2023-03-16 DIAGNOSIS — Z8616 Personal history of COVID-19: Secondary | ICD-10-CM | POA: Diagnosis not present

## 2023-03-16 DIAGNOSIS — J301 Allergic rhinitis due to pollen: Secondary | ICD-10-CM | POA: Diagnosis not present

## 2023-03-16 DIAGNOSIS — R5383 Other fatigue: Secondary | ICD-10-CM | POA: Diagnosis not present

## 2023-03-25 DIAGNOSIS — J438 Other emphysema: Secondary | ICD-10-CM | POA: Diagnosis not present

## 2023-03-25 DIAGNOSIS — M170 Bilateral primary osteoarthritis of knee: Secondary | ICD-10-CM | POA: Diagnosis not present

## 2023-03-25 DIAGNOSIS — U099 Post covid-19 condition, unspecified: Secondary | ICD-10-CM | POA: Diagnosis not present

## 2023-03-25 DIAGNOSIS — Z9981 Dependence on supplemental oxygen: Secondary | ICD-10-CM | POA: Diagnosis not present

## 2023-03-25 DIAGNOSIS — Z6841 Body Mass Index (BMI) 40.0 and over, adult: Secondary | ICD-10-CM | POA: Diagnosis not present

## 2023-03-28 DIAGNOSIS — J9600 Acute respiratory failure, unspecified whether with hypoxia or hypercapnia: Secondary | ICD-10-CM | POA: Diagnosis not present

## 2023-03-28 DIAGNOSIS — I10 Essential (primary) hypertension: Secondary | ICD-10-CM | POA: Diagnosis not present

## 2023-03-29 DIAGNOSIS — G629 Polyneuropathy, unspecified: Secondary | ICD-10-CM | POA: Diagnosis not present

## 2023-03-29 DIAGNOSIS — M5136 Other intervertebral disc degeneration, lumbar region: Secondary | ICD-10-CM | POA: Diagnosis not present

## 2023-03-29 DIAGNOSIS — G894 Chronic pain syndrome: Secondary | ICD-10-CM | POA: Diagnosis not present

## 2023-03-29 DIAGNOSIS — M47816 Spondylosis without myelopathy or radiculopathy, lumbar region: Secondary | ICD-10-CM | POA: Diagnosis not present

## 2023-03-29 DIAGNOSIS — M170 Bilateral primary osteoarthritis of knee: Secondary | ICD-10-CM | POA: Diagnosis not present

## 2023-03-29 DIAGNOSIS — S32010A Wedge compression fracture of first lumbar vertebra, initial encounter for closed fracture: Secondary | ICD-10-CM | POA: Diagnosis not present

## 2023-04-13 DIAGNOSIS — E119 Type 2 diabetes mellitus without complications: Secondary | ICD-10-CM | POA: Diagnosis not present

## 2023-04-14 DIAGNOSIS — Z0100 Encounter for examination of eyes and vision without abnormal findings: Secondary | ICD-10-CM | POA: Diagnosis not present

## 2023-04-27 DIAGNOSIS — G629 Polyneuropathy, unspecified: Secondary | ICD-10-CM | POA: Diagnosis not present

## 2023-04-27 DIAGNOSIS — M47816 Spondylosis without myelopathy or radiculopathy, lumbar region: Secondary | ICD-10-CM | POA: Diagnosis not present

## 2023-04-27 DIAGNOSIS — S32010A Wedge compression fracture of first lumbar vertebra, initial encounter for closed fracture: Secondary | ICD-10-CM | POA: Diagnosis not present

## 2023-04-27 DIAGNOSIS — G894 Chronic pain syndrome: Secondary | ICD-10-CM | POA: Diagnosis not present

## 2023-04-27 DIAGNOSIS — I10 Essential (primary) hypertension: Secondary | ICD-10-CM | POA: Diagnosis not present

## 2023-04-27 DIAGNOSIS — J9600 Acute respiratory failure, unspecified whether with hypoxia or hypercapnia: Secondary | ICD-10-CM | POA: Diagnosis not present

## 2023-04-27 DIAGNOSIS — M170 Bilateral primary osteoarthritis of knee: Secondary | ICD-10-CM | POA: Diagnosis not present

## 2023-05-07 DIAGNOSIS — M1712 Unilateral primary osteoarthritis, left knee: Secondary | ICD-10-CM | POA: Diagnosis not present

## 2023-05-14 DIAGNOSIS — M1712 Unilateral primary osteoarthritis, left knee: Secondary | ICD-10-CM | POA: Diagnosis not present

## 2023-05-28 DIAGNOSIS — I10 Essential (primary) hypertension: Secondary | ICD-10-CM | POA: Diagnosis not present

## 2023-05-28 DIAGNOSIS — J9600 Acute respiratory failure, unspecified whether with hypoxia or hypercapnia: Secondary | ICD-10-CM | POA: Diagnosis not present

## 2023-05-31 DIAGNOSIS — G4733 Obstructive sleep apnea (adult) (pediatric): Secondary | ICD-10-CM | POA: Diagnosis not present

## 2023-05-31 DIAGNOSIS — J301 Allergic rhinitis due to pollen: Secondary | ICD-10-CM | POA: Diagnosis not present

## 2023-05-31 DIAGNOSIS — R5383 Other fatigue: Secondary | ICD-10-CM | POA: Diagnosis not present

## 2023-05-31 DIAGNOSIS — J4551 Severe persistent asthma with (acute) exacerbation: Secondary | ICD-10-CM | POA: Diagnosis not present

## 2023-06-01 DIAGNOSIS — J301 Allergic rhinitis due to pollen: Secondary | ICD-10-CM | POA: Diagnosis not present

## 2023-06-01 DIAGNOSIS — E119 Type 2 diabetes mellitus without complications: Secondary | ICD-10-CM | POA: Diagnosis not present

## 2023-06-01 DIAGNOSIS — L603 Nail dystrophy: Secondary | ICD-10-CM | POA: Diagnosis not present

## 2023-06-01 DIAGNOSIS — R5383 Other fatigue: Secondary | ICD-10-CM | POA: Diagnosis not present

## 2023-06-01 DIAGNOSIS — G4733 Obstructive sleep apnea (adult) (pediatric): Secondary | ICD-10-CM | POA: Diagnosis not present

## 2023-06-01 DIAGNOSIS — L84 Corns and callosities: Secondary | ICD-10-CM | POA: Diagnosis not present

## 2023-06-01 DIAGNOSIS — J4551 Severe persistent asthma with (acute) exacerbation: Secondary | ICD-10-CM | POA: Diagnosis not present

## 2023-06-01 DIAGNOSIS — Z6841 Body Mass Index (BMI) 40.0 and over, adult: Secondary | ICD-10-CM | POA: Diagnosis not present

## 2023-06-02 DIAGNOSIS — J4551 Severe persistent asthma with (acute) exacerbation: Secondary | ICD-10-CM | POA: Diagnosis not present

## 2023-06-02 DIAGNOSIS — M47817 Spondylosis without myelopathy or radiculopathy, lumbosacral region: Secondary | ICD-10-CM | POA: Diagnosis not present

## 2023-06-02 DIAGNOSIS — M47816 Spondylosis without myelopathy or radiculopathy, lumbar region: Secondary | ICD-10-CM | POA: Diagnosis not present

## 2023-06-02 DIAGNOSIS — J301 Allergic rhinitis due to pollen: Secondary | ICD-10-CM | POA: Diagnosis not present

## 2023-06-02 DIAGNOSIS — R5383 Other fatigue: Secondary | ICD-10-CM | POA: Diagnosis not present

## 2023-06-02 DIAGNOSIS — G4733 Obstructive sleep apnea (adult) (pediatric): Secondary | ICD-10-CM | POA: Diagnosis not present

## 2023-06-02 DIAGNOSIS — M48061 Spinal stenosis, lumbar region without neurogenic claudication: Secondary | ICD-10-CM | POA: Diagnosis not present

## 2023-06-02 DIAGNOSIS — M4807 Spinal stenosis, lumbosacral region: Secondary | ICD-10-CM | POA: Diagnosis not present

## 2023-06-04 DIAGNOSIS — G629 Polyneuropathy, unspecified: Secondary | ICD-10-CM | POA: Diagnosis not present

## 2023-06-04 DIAGNOSIS — M47816 Spondylosis without myelopathy or radiculopathy, lumbar region: Secondary | ICD-10-CM | POA: Diagnosis not present

## 2023-06-04 DIAGNOSIS — M170 Bilateral primary osteoarthritis of knee: Secondary | ICD-10-CM | POA: Diagnosis not present

## 2023-06-04 DIAGNOSIS — S32010A Wedge compression fracture of first lumbar vertebra, initial encounter for closed fracture: Secondary | ICD-10-CM | POA: Diagnosis not present

## 2023-06-11 DIAGNOSIS — J4551 Severe persistent asthma with (acute) exacerbation: Secondary | ICD-10-CM | POA: Diagnosis not present

## 2023-06-11 DIAGNOSIS — G4733 Obstructive sleep apnea (adult) (pediatric): Secondary | ICD-10-CM | POA: Diagnosis not present

## 2023-06-11 DIAGNOSIS — J301 Allergic rhinitis due to pollen: Secondary | ICD-10-CM | POA: Diagnosis not present

## 2023-06-11 DIAGNOSIS — R498 Other voice and resonance disorders: Secondary | ICD-10-CM | POA: Diagnosis not present

## 2023-06-24 DIAGNOSIS — Z124 Encounter for screening for malignant neoplasm of cervix: Secondary | ICD-10-CM | POA: Diagnosis not present

## 2023-06-24 DIAGNOSIS — Z1211 Encounter for screening for malignant neoplasm of colon: Secondary | ICD-10-CM | POA: Diagnosis not present

## 2023-06-24 DIAGNOSIS — E119 Type 2 diabetes mellitus without complications: Secondary | ICD-10-CM | POA: Diagnosis not present

## 2023-06-24 DIAGNOSIS — Z1231 Encounter for screening mammogram for malignant neoplasm of breast: Secondary | ICD-10-CM | POA: Diagnosis not present

## 2023-06-24 DIAGNOSIS — Z23 Encounter for immunization: Secondary | ICD-10-CM | POA: Diagnosis not present

## 2023-06-24 DIAGNOSIS — Z6841 Body Mass Index (BMI) 40.0 and over, adult: Secondary | ICD-10-CM | POA: Diagnosis not present

## 2023-06-24 DIAGNOSIS — U099 Post covid-19 condition, unspecified: Secondary | ICD-10-CM | POA: Diagnosis not present

## 2023-06-27 DIAGNOSIS — J9600 Acute respiratory failure, unspecified whether with hypoxia or hypercapnia: Secondary | ICD-10-CM | POA: Diagnosis not present

## 2023-06-27 DIAGNOSIS — I10 Essential (primary) hypertension: Secondary | ICD-10-CM | POA: Diagnosis not present

## 2023-06-28 DIAGNOSIS — J4551 Severe persistent asthma with (acute) exacerbation: Secondary | ICD-10-CM | POA: Diagnosis not present

## 2023-06-28 DIAGNOSIS — G4733 Obstructive sleep apnea (adult) (pediatric): Secondary | ICD-10-CM | POA: Diagnosis not present

## 2023-06-28 DIAGNOSIS — R498 Other voice and resonance disorders: Secondary | ICD-10-CM | POA: Diagnosis not present

## 2023-06-28 DIAGNOSIS — J301 Allergic rhinitis due to pollen: Secondary | ICD-10-CM | POA: Diagnosis not present

## 2023-07-09 DIAGNOSIS — M48061 Spinal stenosis, lumbar region without neurogenic claudication: Secondary | ICD-10-CM | POA: Diagnosis not present

## 2023-07-09 DIAGNOSIS — M170 Bilateral primary osteoarthritis of knee: Secondary | ICD-10-CM | POA: Diagnosis not present

## 2023-07-09 DIAGNOSIS — S32010A Wedge compression fracture of first lumbar vertebra, initial encounter for closed fracture: Secondary | ICD-10-CM | POA: Diagnosis not present

## 2023-07-09 DIAGNOSIS — G629 Polyneuropathy, unspecified: Secondary | ICD-10-CM | POA: Diagnosis not present

## 2023-07-09 DIAGNOSIS — M47816 Spondylosis without myelopathy or radiculopathy, lumbar region: Secondary | ICD-10-CM | POA: Diagnosis not present

## 2023-07-09 DIAGNOSIS — G894 Chronic pain syndrome: Secondary | ICD-10-CM | POA: Diagnosis not present

## 2023-07-28 DIAGNOSIS — I10 Essential (primary) hypertension: Secondary | ICD-10-CM | POA: Diagnosis not present

## 2023-07-28 DIAGNOSIS — J9600 Acute respiratory failure, unspecified whether with hypoxia or hypercapnia: Secondary | ICD-10-CM | POA: Diagnosis not present

## 2023-08-03 DIAGNOSIS — M5416 Radiculopathy, lumbar region: Secondary | ICD-10-CM | POA: Diagnosis not present

## 2023-08-18 DIAGNOSIS — M48061 Spinal stenosis, lumbar region without neurogenic claudication: Secondary | ICD-10-CM | POA: Diagnosis not present

## 2023-08-18 DIAGNOSIS — S32010A Wedge compression fracture of first lumbar vertebra, initial encounter for closed fracture: Secondary | ICD-10-CM | POA: Diagnosis not present

## 2023-08-18 DIAGNOSIS — Z1389 Encounter for screening for other disorder: Secondary | ICD-10-CM | POA: Diagnosis not present

## 2023-08-18 DIAGNOSIS — G894 Chronic pain syndrome: Secondary | ICD-10-CM | POA: Diagnosis not present

## 2023-08-18 DIAGNOSIS — G629 Polyneuropathy, unspecified: Secondary | ICD-10-CM | POA: Diagnosis not present

## 2023-08-18 DIAGNOSIS — M170 Bilateral primary osteoarthritis of knee: Secondary | ICD-10-CM | POA: Diagnosis not present

## 2023-08-18 DIAGNOSIS — M47816 Spondylosis without myelopathy or radiculopathy, lumbar region: Secondary | ICD-10-CM | POA: Diagnosis not present

## 2023-08-26 DIAGNOSIS — M1711 Unilateral primary osteoarthritis, right knee: Secondary | ICD-10-CM | POA: Diagnosis not present

## 2023-08-28 DIAGNOSIS — J9600 Acute respiratory failure, unspecified whether with hypoxia or hypercapnia: Secondary | ICD-10-CM | POA: Diagnosis not present

## 2023-08-28 DIAGNOSIS — I10 Essential (primary) hypertension: Secondary | ICD-10-CM | POA: Diagnosis not present

## 2023-08-31 DIAGNOSIS — M1712 Unilateral primary osteoarthritis, left knee: Secondary | ICD-10-CM | POA: Diagnosis not present

## 2023-09-02 DIAGNOSIS — Z1211 Encounter for screening for malignant neoplasm of colon: Secondary | ICD-10-CM | POA: Diagnosis not present

## 2023-09-10 LAB — COLOGUARD: COLOGUARD: POSITIVE — AB

## 2023-09-16 DIAGNOSIS — Z780 Asymptomatic menopausal state: Secondary | ICD-10-CM | POA: Diagnosis not present

## 2023-09-16 DIAGNOSIS — E119 Type 2 diabetes mellitus without complications: Secondary | ICD-10-CM | POA: Diagnosis not present

## 2023-09-16 DIAGNOSIS — G629 Polyneuropathy, unspecified: Secondary | ICD-10-CM | POA: Diagnosis not present

## 2023-09-16 DIAGNOSIS — Z6841 Body Mass Index (BMI) 40.0 and over, adult: Secondary | ICD-10-CM | POA: Diagnosis not present

## 2023-09-16 DIAGNOSIS — M47816 Spondylosis without myelopathy or radiculopathy, lumbar region: Secondary | ICD-10-CM | POA: Diagnosis not present

## 2023-09-16 DIAGNOSIS — Z1389 Encounter for screening for other disorder: Secondary | ICD-10-CM | POA: Diagnosis not present

## 2023-09-16 DIAGNOSIS — S32010A Wedge compression fracture of first lumbar vertebra, initial encounter for closed fracture: Secondary | ICD-10-CM | POA: Diagnosis not present

## 2023-09-16 DIAGNOSIS — M170 Bilateral primary osteoarthritis of knee: Secondary | ICD-10-CM | POA: Diagnosis not present

## 2023-09-16 DIAGNOSIS — M810 Age-related osteoporosis without current pathological fracture: Secondary | ICD-10-CM | POA: Diagnosis not present

## 2023-09-16 DIAGNOSIS — M48061 Spinal stenosis, lumbar region without neurogenic claudication: Secondary | ICD-10-CM | POA: Diagnosis not present

## 2023-09-16 DIAGNOSIS — U099 Post covid-19 condition, unspecified: Secondary | ICD-10-CM | POA: Diagnosis not present

## 2023-09-16 DIAGNOSIS — Z114 Encounter for screening for human immunodeficiency virus [HIV]: Secondary | ICD-10-CM | POA: Diagnosis not present

## 2023-09-16 DIAGNOSIS — M8000XS Age-related osteoporosis with current pathological fracture, unspecified site, sequela: Secondary | ICD-10-CM | POA: Diagnosis not present

## 2023-09-24 DIAGNOSIS — G4733 Obstructive sleep apnea (adult) (pediatric): Secondary | ICD-10-CM | POA: Diagnosis not present

## 2023-09-25 DIAGNOSIS — I10 Essential (primary) hypertension: Secondary | ICD-10-CM | POA: Diagnosis not present

## 2023-09-25 DIAGNOSIS — J9600 Acute respiratory failure, unspecified whether with hypoxia or hypercapnia: Secondary | ICD-10-CM | POA: Diagnosis not present

## 2023-09-28 DIAGNOSIS — M47816 Spondylosis without myelopathy or radiculopathy, lumbar region: Secondary | ICD-10-CM | POA: Diagnosis not present

## 2023-10-05 DIAGNOSIS — G4733 Obstructive sleep apnea (adult) (pediatric): Secondary | ICD-10-CM | POA: Diagnosis not present

## 2023-10-05 DIAGNOSIS — R498 Other voice and resonance disorders: Secondary | ICD-10-CM | POA: Diagnosis not present

## 2023-10-05 DIAGNOSIS — J301 Allergic rhinitis due to pollen: Secondary | ICD-10-CM | POA: Diagnosis not present

## 2023-10-05 DIAGNOSIS — J4550 Severe persistent asthma, uncomplicated: Secondary | ICD-10-CM | POA: Diagnosis not present

## 2023-10-06 DIAGNOSIS — M1711 Unilateral primary osteoarthritis, right knee: Secondary | ICD-10-CM | POA: Diagnosis not present

## 2023-10-14 DIAGNOSIS — M25611 Stiffness of right shoulder, not elsewhere classified: Secondary | ICD-10-CM | POA: Diagnosis not present

## 2023-10-14 DIAGNOSIS — M25612 Stiffness of left shoulder, not elsewhere classified: Secondary | ICD-10-CM | POA: Diagnosis not present

## 2023-10-14 DIAGNOSIS — M6281 Muscle weakness (generalized): Secondary | ICD-10-CM | POA: Diagnosis not present

## 2023-10-14 DIAGNOSIS — R5383 Other fatigue: Secondary | ICD-10-CM | POA: Diagnosis not present

## 2023-10-14 DIAGNOSIS — U099 Post covid-19 condition, unspecified: Secondary | ICD-10-CM | POA: Diagnosis not present

## 2023-10-14 DIAGNOSIS — R2681 Unsteadiness on feet: Secondary | ICD-10-CM | POA: Diagnosis not present

## 2023-10-14 DIAGNOSIS — G8929 Other chronic pain: Secondary | ICD-10-CM | POA: Diagnosis not present

## 2023-10-18 DIAGNOSIS — Z1389 Encounter for screening for other disorder: Secondary | ICD-10-CM | POA: Diagnosis not present

## 2023-10-18 DIAGNOSIS — S32010A Wedge compression fracture of first lumbar vertebra, initial encounter for closed fracture: Secondary | ICD-10-CM | POA: Diagnosis not present

## 2023-10-18 DIAGNOSIS — G629 Polyneuropathy, unspecified: Secondary | ICD-10-CM | POA: Diagnosis not present

## 2023-10-18 DIAGNOSIS — M170 Bilateral primary osteoarthritis of knee: Secondary | ICD-10-CM | POA: Diagnosis not present

## 2023-10-18 DIAGNOSIS — M48061 Spinal stenosis, lumbar region without neurogenic claudication: Secondary | ICD-10-CM | POA: Diagnosis not present

## 2023-10-18 DIAGNOSIS — M47816 Spondylosis without myelopathy or radiculopathy, lumbar region: Secondary | ICD-10-CM | POA: Diagnosis not present

## 2023-10-26 DIAGNOSIS — J9600 Acute respiratory failure, unspecified whether with hypoxia or hypercapnia: Secondary | ICD-10-CM | POA: Diagnosis not present

## 2023-10-26 DIAGNOSIS — I10 Essential (primary) hypertension: Secondary | ICD-10-CM | POA: Diagnosis not present

## 2023-10-26 DIAGNOSIS — M47816 Spondylosis without myelopathy or radiculopathy, lumbar region: Secondary | ICD-10-CM | POA: Diagnosis not present

## 2023-11-22 DIAGNOSIS — M48061 Spinal stenosis, lumbar region without neurogenic claudication: Secondary | ICD-10-CM | POA: Diagnosis not present

## 2023-11-22 DIAGNOSIS — M170 Bilateral primary osteoarthritis of knee: Secondary | ICD-10-CM | POA: Diagnosis not present

## 2023-11-22 DIAGNOSIS — M47816 Spondylosis without myelopathy or radiculopathy, lumbar region: Secondary | ICD-10-CM | POA: Diagnosis not present

## 2023-11-22 DIAGNOSIS — S32010A Wedge compression fracture of first lumbar vertebra, initial encounter for closed fracture: Secondary | ICD-10-CM | POA: Diagnosis not present

## 2023-11-22 DIAGNOSIS — Z1389 Encounter for screening for other disorder: Secondary | ICD-10-CM | POA: Diagnosis not present

## 2023-11-22 DIAGNOSIS — G629 Polyneuropathy, unspecified: Secondary | ICD-10-CM | POA: Diagnosis not present

## 2023-11-25 DIAGNOSIS — I10 Essential (primary) hypertension: Secondary | ICD-10-CM | POA: Diagnosis not present

## 2023-11-25 DIAGNOSIS — J9600 Acute respiratory failure, unspecified whether with hypoxia or hypercapnia: Secondary | ICD-10-CM | POA: Diagnosis not present

## 2023-12-17 DIAGNOSIS — M8580 Other specified disorders of bone density and structure, unspecified site: Secondary | ICD-10-CM | POA: Diagnosis not present

## 2023-12-17 DIAGNOSIS — Z780 Asymptomatic menopausal state: Secondary | ICD-10-CM | POA: Diagnosis not present

## 2023-12-20 DIAGNOSIS — Z780 Asymptomatic menopausal state: Secondary | ICD-10-CM | POA: Diagnosis not present

## 2023-12-20 DIAGNOSIS — M85852 Other specified disorders of bone density and structure, left thigh: Secondary | ICD-10-CM | POA: Diagnosis not present

## 2023-12-20 DIAGNOSIS — M85851 Other specified disorders of bone density and structure, right thigh: Secondary | ICD-10-CM | POA: Diagnosis not present

## 2023-12-26 DIAGNOSIS — J9600 Acute respiratory failure, unspecified whether with hypoxia or hypercapnia: Secondary | ICD-10-CM | POA: Diagnosis not present

## 2023-12-26 DIAGNOSIS — I10 Essential (primary) hypertension: Secondary | ICD-10-CM | POA: Diagnosis not present

## 2023-12-29 DIAGNOSIS — Z79891 Long term (current) use of opiate analgesic: Secondary | ICD-10-CM | POA: Diagnosis not present

## 2023-12-29 DIAGNOSIS — S32010A Wedge compression fracture of first lumbar vertebra, initial encounter for closed fracture: Secondary | ICD-10-CM | POA: Diagnosis not present

## 2023-12-29 DIAGNOSIS — M47816 Spondylosis without myelopathy or radiculopathy, lumbar region: Secondary | ICD-10-CM | POA: Diagnosis not present

## 2024-01-07 DIAGNOSIS — M1712 Unilateral primary osteoarthritis, left knee: Secondary | ICD-10-CM | POA: Diagnosis not present

## 2024-01-12 DIAGNOSIS — G4733 Obstructive sleep apnea (adult) (pediatric): Secondary | ICD-10-CM | POA: Diagnosis not present

## 2024-01-12 DIAGNOSIS — J4550 Severe persistent asthma, uncomplicated: Secondary | ICD-10-CM | POA: Diagnosis not present

## 2024-01-12 DIAGNOSIS — R498 Other voice and resonance disorders: Secondary | ICD-10-CM | POA: Diagnosis not present

## 2024-01-12 DIAGNOSIS — J301 Allergic rhinitis due to pollen: Secondary | ICD-10-CM | POA: Diagnosis not present

## 2024-01-19 DIAGNOSIS — M25561 Pain in right knee: Secondary | ICD-10-CM | POA: Diagnosis not present

## 2024-01-19 DIAGNOSIS — G8929 Other chronic pain: Secondary | ICD-10-CM | POA: Diagnosis not present

## 2024-01-20 DIAGNOSIS — Z1231 Encounter for screening mammogram for malignant neoplasm of breast: Secondary | ICD-10-CM | POA: Diagnosis not present

## 2024-01-25 DIAGNOSIS — I10 Essential (primary) hypertension: Secondary | ICD-10-CM | POA: Diagnosis not present

## 2024-01-25 DIAGNOSIS — J9600 Acute respiratory failure, unspecified whether with hypoxia or hypercapnia: Secondary | ICD-10-CM | POA: Diagnosis not present

## 2024-02-01 DIAGNOSIS — M47816 Spondylosis without myelopathy or radiculopathy, lumbar region: Secondary | ICD-10-CM | POA: Diagnosis not present

## 2024-02-14 ENCOUNTER — Ambulatory Visit: Admitting: Internal Medicine

## 2024-02-16 ENCOUNTER — Encounter: Payer: Self-pay | Admitting: Internal Medicine

## 2024-02-16 ENCOUNTER — Ambulatory Visit: Admitting: Internal Medicine

## 2024-02-16 VITALS — BP 130/82 | HR 77 | Temp 97.5°F | Resp 18 | Ht 60.0 in | Wt 219.1 lb

## 2024-02-16 DIAGNOSIS — E785 Hyperlipidemia, unspecified: Secondary | ICD-10-CM

## 2024-02-16 DIAGNOSIS — I10 Essential (primary) hypertension: Secondary | ICD-10-CM | POA: Diagnosis not present

## 2024-02-16 DIAGNOSIS — J4550 Severe persistent asthma, uncomplicated: Secondary | ICD-10-CM

## 2024-02-16 DIAGNOSIS — E1169 Type 2 diabetes mellitus with other specified complication: Secondary | ICD-10-CM

## 2024-02-16 DIAGNOSIS — Z6841 Body Mass Index (BMI) 40.0 and over, adult: Secondary | ICD-10-CM | POA: Diagnosis not present

## 2024-02-16 DIAGNOSIS — E662 Morbid (severe) obesity with alveolar hypoventilation: Secondary | ICD-10-CM | POA: Diagnosis not present

## 2024-02-16 MED ORDER — DEXCOM G7 SENSOR MISC: 11 refills | Status: AC

## 2024-02-16 MED ORDER — OZEMPIC (1 MG/DOSE) 4 MG/3ML ~~LOC~~ SOPN: 1.0000 mg | PEN_INJECTOR | SUBCUTANEOUS | 6 refills | Status: DC

## 2024-02-16 NOTE — Assessment & Plan Note (Signed)
 She will decrease the dose of Lantus  to 20 units daily.  I will increase was MP to 1 mg daily that will help to wean her from insulin  for cardiorenal protection.  This will also help her to lose weight.  I will send continues glucose monitor.  I will do labs on next visit.

## 2024-02-16 NOTE — Assessment & Plan Note (Signed)
 She takes pravastatin  and her LDL is target control

## 2024-02-16 NOTE — Assessment & Plan Note (Signed)
 She will cut down portion of her meal and will increase the dose of Ozempic .

## 2024-02-16 NOTE — Assessment & Plan Note (Signed)
 Her blood pressure is controlled.  She is not taking any ACE-inhibitor and I will readjust medication next visit.

## 2024-02-16 NOTE — Assessment & Plan Note (Signed)
 She will continue a trelegy inhaler and follow with Dr. Mardee.

## 2024-02-16 NOTE — Progress Notes (Signed)
 New Patient Office Visit  Subjective    Patient ID: Julia Henderson, female    DOB: [birth year]  Age: 63 y.o. MRN: [id number]  CC:  Chief Complaint  Patient presents with   New Patient (Initial Visit)    New patient    HPI Julia Henderson presents to establish care with us . She has diabetes mellitus and her last hemoglobin A1c 6 months ago was 6.2%.  She take Lantus  28 units daily and Ozempic  0.5 mg once a week.  She admitted that she has few time hypoglycemic episodes.  She is not using continuous glucose monitor.  She says that her insurance company call that she need to have repeat hemoglobin A1c.  She says that she has neuropathy and she has been using gabapentin  900 mg in the morning, 900 mg at lunch and 1200 mg at nighttime.  It is not helping.  She was told that they wanted to switch to another medicine but she has to taper Gabapentin  off.    She also has hyperlipidemia and her lipid panel is well controlled.  She takes pravastatin  40 mg daily.   She has hypertension and she has skipped beat so she was started on metoprolol  100 mg twice a day.  She also take hydralazine  25 mg 3 times a day.  She is not taking any Ace and Arb.    She is morbidly obese female and she is says that she wanted to lose weight.  Her weight is 219 lb with BMI of 42.   She also has a history of depression and she takes venlafaxine 75 mg daily.    She also has a chronic back pain and she follows with pain clinic.  She will be giving back injection.  She also take hydrocodone  from them.    She has obstructive sleep apnea and she use CPAP.  And she also follow-up with Dr. Mardee for her asthma.     Outpatient Encounter Medications as of 02/16/2024  Medication Sig   HYDROcodone -acetaminophen  (NORCO) 7.5-325 MG tablet Take 1 tablet by mouth every 8 (eight) hours as needed.   albuterol  (PROVENTIL ) (2.5 MG/3ML) 0.083% nebulizer solution Take 2.5 mg by nebulization every 6 (six) hours as needed for wheezing or  shortness of breath.   albuterol  (VENTOLIN  HFA) 108 (90 Base) MCG/ACT inhaler Inhale 1-2 puffs into the lungs every 6 (six) hours as needed for wheezing or shortness of breath.   blood glucose meter kit and supplies KIT Dispense based on patient and insurance preference. Use up to four times daily as directed.   Blood Glucose Monitoring Suppl (PRECISION XTRA) DEVI Dispense based on patient and insurance preference. Use up to four times daily as directed.   cetirizine (ZYRTEC) 10 MG tablet Take 10 mg by mouth daily.   Cholecalciferol  (VITAMIN D3) 125 MCG (5000 UT) TABS Take 1 tablet (5,000 Units total) by mouth daily.   DUPIXENT 300 MG/2ML SOPN Inject 2 mLs into the skin every 14 (fourteen) days.   Fluticasone -Umeclidin-Vilant (TRELEGY ELLIPTA) 100-62.5-25 MCG/ACT AEPB Take 1 puff(s) inhaled once a day   gabapentin  (NEURONTIN ) 300 MG capsule Take 3 capsules (900 mg total) by mouth 3 (three) times daily. (Patient taking differently: Take 900 mg by mouth 2 (two) times daily. Takes 300 mg by mouth at bedtime)   hydrALAZINE  (APRESOLINE ) 25 MG tablet Take 25 mg by mouth every 8 (eight) hours.   insulin  glargine (LANTUS  SOLOSTAR) 100 UNIT/ML Solostar Pen Inject 28 Units into the skin daily.  Insulin  Pen Needle 29G X MISC Use with insulin    metoprolol  tartrate (LOPRESSOR ) 100 MG tablet Take 1 tablet (100 mg total) by mouth 2 (two) times daily.   montelukast  (SINGULAIR ) 10 MG tablet Take 1 tablet (10 mg total) by mouth at bedtime.   Multiple Vitamin (MULTIVITAMIN) tablet Take 1 tablet by mouth daily.   omega-3 acid ethyl esters (LOVAZA ) 1 g capsule Take 1 g by mouth daily.   OZEMPIC , 0.25 OR 0.5 MG/DOSE, 2 MG/3ML SOPN Inject 0.25 mg into the skin once a week.   pantoprazole  (PROTONIX ) 40 MG tablet Take 1 tablet (40 mg total) by mouth daily at 6 (six) AM.   pravastatin  (PRAVACHOL ) 40 MG tablet Take 1 tablet (40 mg total) by mouth at bedtime.   traZODone  (DESYREL ) 100 MG tablet Take 1 tablet (100 mg  total) by mouth at bedtime.   venlafaxine (EFFEXOR) 75 MG tablet Take 75 mg by mouth daily.   vitamin C (ASCORBIC ACID ) 500 MG tablet Take 500 mg by mouth daily.   ZTLIDO 1.8 % PTCH Apply 1 patch topically 3 (three) times daily.   No facility-administered encounter medications on file as of 02/16/2024.    Past Medical History:  Diagnosis Date   Acute on chronic respiratory failure (HCC) 11/30/2020   Acute respiratory failure with hypoxia (HCC) 10/07/2020   Allergic rhinitis    Benign essential hypertension 01/07/2022   Bronchitis    Chronic pain disorder 01/28/2021   Chronic use of opiate for therapeutic purpose 03/04/2022   Last Assessment & Plan:    Patient and I have discussed the hazardous effects of continued opiate pain medication usage. Risks and benefits of above medications including but not limited to possibility of hyperalgesia, respiratory depression, sedation, and even death were discussed with the patient who expressed an understanding. Patient is willing to assume these health risks as mentioned above    COPD exacerbation (HCC) 11/30/2020   DDD (degenerative disc disease), lumbar 02/03/2022   Last Assessment & Plan:    See spondylosis plan.     DM2 (diabetes mellitus, type 2) (HCC) 10/07/2020   High cholesterol    Hoarseness    HTN (hypertension) 10/07/2020   Hyperlipidemia 11/30/2020   Hypertension    Hypertensive urgency 11/30/2020   Insomnia, unspecified 09/24/2020   Morbid (severe) obesity with alveolar hypoventilation (HCC)    Morbid obesity (HCC) 10/07/2020   Morbid obesity with BMI of 45.0-49.9, adult (HCC) 02/03/2022   Last Assessment & Plan:    Patient educated about the detrimental effects of weight as it specifically pertains to pain management and overall health.  Patient's BMI 46.10 Encouraged healthy eating habits and routine low-impact cardiovascular exercises as tolerated.  Patient reports recent dietary changes and increased low-impact cardiovascular  exercises which has resulted in an approximate 20-25    Neuropathy 01/07/2022   OSA (obstructive sleep apnea)    Other fatigue 06/05/2021   Pain medication agreement 02/03/2022   Last Assessment & Plan:    Agreement reviewed and discussed, signed and placed in patient's chart today.       UDS to be collected in compliance with clinic policies and procedures.        Patient did not display any signs of abuse or diversion and has been checked on the [place]  Controlled Substance website.     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      Review of previous lumbar spine plain films completed in 2021 detailed in HPI.  She has been working on physician directed exercises including seated lower back stretches, upper trunk rot    Past Surgical History:  Procedure Laterality Date   CESAREAN SECTION      Family History  Problem Relation Age of Onset   Rheumatic fever Mother    Heart disease Father    Lung disease Neg Hx     Social History   Socioeconomic History   Marital status: Single    Spouse name: Not on file   Number of children: Not on file   Years of education: Not on file   Highest education level: Not on file  Occupational History   Not on file  Tobacco Use   Smoking status: Never   Smokeless tobacco: Never  Vaping Use   Vaping status: Never Used  Substance and Sexual Activity   Alcohol use: Yes    Comment: very rare   Drug use: Not Currently   Sexual activity: Not on file  Other Topics Concern   Not on file   Social History Narrative   Not on file   Social Drivers of Health   Financial Resource Strain: Not at Risk (03/25/2023)   Received from [employer]    How hard is it for you to pay for the very basics like food, housing, heating, medical care, and medications?: 1  Food Insecurity: Not at Risk (03/25/2023)   Received from [facility] Insecurity    Within the past 12 months, you worried that your food would run out before you got money to buy more.: 1  Transportation Needs: Not at Risk (03/25/2023)   Received from [employer] Needs    In the past 12 months, has lack of transportation kept you from medical appointments, meetings, work or from getting things needed for daily living?: 1  Physical Activity: At Risk (03/25/2023)   Received from [facility]   Physical Activity    On average, how many minutes do you engage in exercise at this level?: 2  Stress: Not at Risk (03/25/2023)   Received from [facility]   Stress    Do you feel these kinds of stress these days?: 1  Social Connections: Not at Risk (03/25/2023)   Received from [facility]   Social Connections    How often do you see or talk to people that you care about and feel close to? (For example: talking to friends on phone, visiting friends or family, going to church or club meetings): 1  Intimate Partner Violence: Not on file    Review of Systems  Constitutional: Negative.   HENT: Negative.    Respiratory: Negative.    Cardiovascular: Negative.   Gastrointestinal: Negative.   Musculoskeletal:  Positive for back pain.  Neurological: Negative.         Objective    BP 130/82   Pulse 77   Temp (!) 97.5 F (36.4 C)   Resp 18   Ht 5' (1.524 m)   Wt 219 lb 2 oz (99.4 kg)   SpO2 97%  BMI 42.79 kg/m   Physical Exam Constitutional:      Appearance: Normal appearance. She is obese.  Cardiovascular:     Rate and Rhythm: Normal rate and regular rhythm.     Heart sounds: Normal heart sounds.  Pulmonary:      Effort: Pulmonary effort is normal.     Breath sounds: Normal breath sounds.  Abdominal:     General: Bowel sounds are normal.     Palpations: Abdomen is soft.  Neurological:     General: No focal deficit present.     Mental Status: She is alert and oriented to person, place, and time.         Assessment & Plan:   Problem List Items Addressed This Visit       Cardiovascular and Mediastinum   Hypertension - Primary     Her blood pressure is controlled.  She is not taking any ACE-inhibitor and I will readjust medication next visit.        Respiratory   Morbid (severe) obesity with alveolar hypoventilation (HCC)     She will cut down portion of her meal and will increase the dose of Ozempic .      Relevant Medications   Semaglutide , 1 MG/DOSE, (OZEMPIC , 1 MG/DOSE,) 4 MG/3ML SOPN   Severe persistent asthma     She will continue a trelegy inhaler and follow with Dr. Mardee.        Endocrine   Dyslipidemia associated with type 2 diabetes mellitus (HCC)    She will decrease the dose of Lantus  to 20 units daily.  I will increase was MP to 1 mg daily that will help to wean her from insulin  for cardiorenal protection.  This will also help her to lose weight.  I will send continues glucose monitor.  I will do labs on next visit.      Relevant Medications   Semaglutide , 1 MG/DOSE, (OZEMPIC , 1 MG/DOSE,) 4 MG/3ML SOPN     Other   Hyperlipidemia     She takes pravastatin  and her LDL is target control       Return in about 1 month (around 03/18/2024).   Roetta Dare, MD

## 2024-02-25 DIAGNOSIS — I10 Essential (primary) hypertension: Secondary | ICD-10-CM | POA: Diagnosis not present

## 2024-02-25 DIAGNOSIS — J9600 Acute respiratory failure, unspecified whether with hypoxia or hypercapnia: Secondary | ICD-10-CM | POA: Diagnosis not present

## 2024-03-02 DIAGNOSIS — G4733 Obstructive sleep apnea (adult) (pediatric): Secondary | ICD-10-CM | POA: Diagnosis not present

## 2024-03-17 ENCOUNTER — Ambulatory Visit: Admitting: Internal Medicine

## 2024-03-27 ENCOUNTER — Other Ambulatory Visit (HOSPITAL_COMMUNITY): Payer: Self-pay

## 2024-03-27 ENCOUNTER — Encounter: Payer: Self-pay | Admitting: Internal Medicine

## 2024-03-27 ENCOUNTER — Ambulatory Visit: Admitting: Internal Medicine

## 2024-03-27 VITALS — BP 130/80 | HR 77 | Temp 97.3°F | Resp 18 | Ht 60.0 in | Wt 218.0 lb

## 2024-03-27 DIAGNOSIS — I10 Essential (primary) hypertension: Secondary | ICD-10-CM | POA: Diagnosis not present

## 2024-03-27 DIAGNOSIS — E1169 Type 2 diabetes mellitus with other specified complication: Secondary | ICD-10-CM

## 2024-03-27 DIAGNOSIS — E785 Hyperlipidemia, unspecified: Secondary | ICD-10-CM

## 2024-03-27 DIAGNOSIS — J4550 Severe persistent asthma, uncomplicated: Secondary | ICD-10-CM | POA: Diagnosis not present

## 2024-03-27 DIAGNOSIS — E662 Morbid (severe) obesity with alveolar hypoventilation: Secondary | ICD-10-CM

## 2024-03-27 MED ORDER — SEMAGLUTIDE(0.25 OR 0.5MG/DOS) 2 MG/3ML ~~LOC~~ SOPN
0.5000 mg | PEN_INJECTOR | SUBCUTANEOUS | 6 refills | Status: DC
  Filled 2024-03-27: qty 3, fill #0

## 2024-03-27 NOTE — Progress Notes (Signed)
 Office Visit  Subjective   Patient ID: Julia Henderson   DOB: 02-21-1961   Age: 63 y.o.   MRN: [id number]   Chief Complaint Chief Complaint  Patient presents with   Follow-up    Follow up     History of Present Illness Julia Henderson 63 years old is here as follow up from ED at [facility]. She says her sugar was 48 on Dexcom and she was feeling weak and nauseous so she went to ED. She has diabetes mellitus and her last hemoglobin A1c 6 months ago was 6.2%. She take Lantus  28 units daily and Ozempic  0.5 mg once a week.  She admitted that she has few time hypoglycemic episodes.  She is not using continuous glucose monitor.  She says that her insurance company call that she need to have repeat hemoglobin A1c.  She says that she has neuropathy and she has been using gabapentin  900 mg in the morning, 900 mg at lunch and 1200 mg at nighttime.  It is not helping.  She was told that they wanted to switch to another medicine but she has to taper Gabapentin  off.     She also has hyperlipidemia and her lipid panel is well controlled.  She takes pravastatin  40 mg daily.    She has hypertension and she has skipped beat so she was started on metoprolol  100 mg twice a day.  She also take hydralazine  25 mg 3 times a day.  She is not taking any Ace and Arb.     She is morbidly obese female and she is says that she wanted to lose weight.  Her weight is 219 lb with BMI of 42.    She also has a history of depression and she takes venlafaxine 75 mg daily.     She also has a chronic back pain and she follows with pain clinic.  She will be giving back injection.  She also take hydrocodone  from them.     She has obstructive sleep apnea and she use CPAP.  And she also follow-up with Dr. Mardee for her asthma.      Past Medical History Past Medical History:  Diagnosis Date   Acute on chronic respiratory failure (HCC) 11/30/2020   Acute respiratory failure with hypoxia (HCC) 10/07/2020   Allergic  rhinitis    Benign essential hypertension 01/07/2022   Bronchitis    Chronic pain disorder 01/28/2021   Chronic use of opiate for therapeutic purpose 03/04/2022   Last Assessment & Plan:    Patient and I have discussed the hazardous effects of continued opiate pain medication usage. Risks and benefits of above medications including but not limited to possibility of hyperalgesia, respiratory depression, sedation, and even death were discussed with the patient who expressed an understanding. Patient is willing to assume these health risks as mentioned above    COPD exacerbation (HCC) 11/30/2020   DDD (degenerative disc disease), lumbar 02/03/2022   Last Assessment & Plan:    See spondylosis plan.     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  Patient's BMI 46.10 Encouraged healthy eating habits and routine low-impact cardiovascular exercises as tolerated.  Patient reports recent dietary changes and increased low-impact cardiovascular exercises which has resulted in an approximate 20-25    Neuropathy 01/07/2022   OSA (obstructive sleep apnea)    Other fatigue 06/05/2021   Pain medication agreement 02/03/2022   Last Assessment & Plan:    Agreement reviewed and discussed, signed and placed in patient's chart today.       UDS to be collected in compliance with clinic policies and procedures.        Patient did not display any signs of abuse or diversion and has been checked on the [place]   Controlled Substance website.     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      Review of previous lumbar spine plain films completed in 2021 detailed in HPI.  She has been working on physician directed exercises including seated lower back stretches, upper trunk rot     Allergies Allergies  Allergen Reactions   Simvastatin    Celexa [Citalopram]      Review of Systems Review of Systems  Constitutional: Negative.   HENT: Negative.    Respiratory: Negative.    Cardiovascular: Negative.   Gastrointestinal: Negative.   Neurological: Negative.        Objective:    Vitals BP 130/80   Pulse 77   Temp (!) 97.3 F (36.3 C)   Resp 18   Ht 5' (1.524 m)   Wt 218 lb (98.9 kg)   SpO2 99%   BMI 42.58 kg/m    Physical Examination Physical Exam Constitutional:      Appearance: Normal appearance. She is obese.  HENT:     Head: Normocephalic and atraumatic.  Cardiovascular:     Rate and Rhythm: Normal rate and regular rhythm.     Heart sounds: Normal heart sounds.  Pulmonary:     Effort: Pulmonary effort is normal.     Breath sounds: Normal breath sounds.  Abdominal:     General: Bowel sounds are normal.     Palpations: Abdomen is soft.  Neurological:     General: No focal deficit present.     Mental Status: She is alert and oriented to person, place, and time.        Assessment & Plan:   No problem-specific Assessment & Plan notes found for this encounter.    Return in about 3 months (around 06/26/2024).   Roetta Dare,  MD

## 2024-03-28 ENCOUNTER — Other Ambulatory Visit: Payer: Self-pay | Admitting: Internal Medicine

## 2024-03-28 LAB — HEMOGLOBIN A1C
Est. average glucose Bld gHb Est-mCnc: 103 mg/dL
Hgb A1c MFr Bld: 5.2 % (ref 4.8–5.6)

## 2024-03-28 MED ORDER — SEMAGLUTIDE(0.25 OR 0.5MG/DOS) 2 MG/3ML ~~LOC~~ SOPN: 0.5000 mg | PEN_INJECTOR | SUBCUTANEOUS | 6 refills | Status: DC

## 2024-04-13 ENCOUNTER — Ambulatory Visit: Admitting: Cardiology

## 2024-04-27 ENCOUNTER — Ambulatory Visit: Admitting: Cardiology

## 2024-06-26 ENCOUNTER — Encounter: Payer: Self-pay | Admitting: Internal Medicine

## 2024-06-26 ENCOUNTER — Ambulatory Visit: Admitting: Internal Medicine

## 2024-06-26 VITALS — BP 124/80 | HR 82 | Temp 97.3°F | Resp 18 | Ht 60.0 in | Wt 219.4 lb

## 2024-06-26 DIAGNOSIS — I10 Essential (primary) hypertension: Secondary | ICD-10-CM

## 2024-06-26 DIAGNOSIS — G629 Polyneuropathy, unspecified: Secondary | ICD-10-CM

## 2024-06-26 DIAGNOSIS — E1169 Type 2 diabetes mellitus with other specified complication: Secondary | ICD-10-CM

## 2024-06-26 DIAGNOSIS — G4733 Obstructive sleep apnea (adult) (pediatric): Secondary | ICD-10-CM

## 2024-06-26 DIAGNOSIS — E662 Morbid (severe) obesity with alveolar hypoventilation: Secondary | ICD-10-CM

## 2024-06-26 DIAGNOSIS — G8929 Other chronic pain: Secondary | ICD-10-CM | POA: Insufficient documentation

## 2024-06-26 MED ORDER — OZEMPIC (1 MG/DOSE) 4 MG/3ML ~~LOC~~ SOPN: 1.0000 mg | PEN_INJECTOR | SUBCUTANEOUS | 3 refills | Status: AC

## 2024-06-26 MED ORDER — HYDROCODONE-ACETAMINOPHEN 7.5-325 MG PO TABS: 1.0000 | ORAL_TABLET | Freq: Three times a day (TID) | ORAL | 0 refills | Status: DC | PRN

## 2024-06-26 NOTE — Assessment & Plan Note (Signed)
 Her diabetes and cholesterol are well controlled.  Will continue to monitor.

## 2024-06-26 NOTE — Assessment & Plan Note (Signed)
 Her blood pressure is controlled.

## 2024-06-26 NOTE — Assessment & Plan Note (Signed)
 She will sign a narcotic contract and I will send prescription of hydrocodone .

## 2024-06-26 NOTE — Assessment & Plan Note (Signed)
 Her BMI is still 42 I will increase the dose of Ozempic  to 1 mg once a week.  She will continue to watch her diet.

## 2024-06-26 NOTE — Progress Notes (Signed)
 Office Visit  Subjective   Patient ID: Julia Henderson   DOB: Jun 20, 1961   Age: 63 y.o.   MRN: [id number]   Chief Complaint Chief Complaint  Patient presents with   Fpllow up    3 month     History of Present Illness Julia Henderson 63 years old is here as follow up.   She says that she is feeling better.  I have reviewed her hemoglobin A1c that was done on September 8 that was 5.2.  She has a continuous glucose monitor and I have stopped her insulin  on last visit. She saw optometrist at [facility] in October and no diabetic changes were noted.     She says that she has neuropathy and she has been using gabapentin  600 mg twice day.  And she has not noticed much difference in her neuropathy symptoms.  Goal is to wean her gabapentin  off and try pregabalin.   She also has hyperlipidemia and her lipid panel is well controlled.  She takes pravastatin  40 mg daily.   She has hypertension and she has skipped beat so she was started on metoprolol  100 mg twice a day.  She also take hydralazine  25 mg 3 times a day.  She is not taking any Ace and Arb.   She is morbidly obese female and she  Has gained 1 lb since last visit and her weight is 219 lb with BMI of 42.    She also has a history of depression and she takes venlafaxine 75 mg daily.     She also has a chronic back pain and she follows with pain clinic.  She will be giving back injection.  She also take hydrocodone  from them.  She says that her pain is 6/10 and radiates to right leg.  She is asking if  I can send prescription for her pain medication.  She will sign a narcotic contract with us .     She has obstructive sleep apnea and she use CPAP.  And she also follow-up with Dr. Mardee for her asthma.    Past Medical History Past Medical History:  Diagnosis Date   Acute on chronic respiratory failure (HCC) 11/30/2020   Acute respiratory failure with hypoxia (HCC) 10/07/2020   Allergic rhinitis    Benign essential hypertension  01/07/2022   Bronchitis    Chronic pain disorder 01/28/2021   Chronic use of opiate for therapeutic purpose 03/04/2022   Last Assessment & Plan:    Patient and I have discussed the hazardous effects of continued opiate pain medication usage. Risks and benefits of above medications including but not limited to possibility of hyperalgesia, respiratory depression, sedation, and even death were discussed with the patient who expressed an understanding. Patient is willing to assume these health risks as mentioned above    COPD exacerbation (HCC) 11/30/2020   Coronary artery calcification seen on CT scan 03/04/2023   DDD (degenerative disc disease), lumbar 02/03/2022   Last Assessment & Plan:    See spondylosis plan.     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  Patient's BMI 46.10 Encouraged healthy eating habits and routine low-impact cardiovascular exercises as tolerated.  Patient reports recent dietary changes and increased low-impact cardiovascular exercises which has resulted in an approximate 20-25    Neuropathy 01/07/2022   OSA (obstructive sleep apnea)    Other fatigue 06/05/2021   Pain medication agreement 02/03/2022   Last Assessment & Plan:    Agreement reviewed and discussed, signed and placed in patient's chart today.       UDS to be collected in compliance with clinic policies and procedures.        Patient did  not display any signs of abuse or diversion and has been checked on the [place]  Controlled Substance website.     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      Review of previous lumbar spine plain films completed in 2021 detailed in HPI.  She has been working on physician directed exercises including seated lower back stretches, upper trunk rot     Allergies Allergies  Allergen Reactions   Simvastatin    Celexa [Citalopram]      Review of Systems Review of Systems  Constitutional: Negative.   HENT: Negative.    Respiratory: Negative.    Cardiovascular: Negative.   Gastrointestinal: Negative.   Musculoskeletal:  Positive for back pain.  Neurological:  Positive for tingling.       Objective:    Vitals BP 124/80 (BP Location: Left Arm, Patient Position: Sitting, Cuff Size: Large)   Pulse 82   Temp (!) 97.3 F (36.3 C)   Resp 18   Ht 5' (1.524 m)   Wt 219 lb 6 oz (99.5 kg)   SpO2 95%   BMI 42.84 kg/m    Physical Examination Physical Exam Constitutional:      Appearance: Normal appearance. She is obese.  HENT:     Head: Normocephalic and atraumatic.  Eyes:     Extraocular Movements: Extraocular movements intact.     Pupils: Pupils are equal, round, and reactive to light.  Cardiovascular:     Rate and Rhythm: Normal rate and regular rhythm.     Heart sounds: Normal heart sounds.  Pulmonary:     Effort: Pulmonary effort is normal.     Breath sounds: Normal breath sounds.  Abdominal:     Palpations:  Abdomen is soft.  Neurological:     General: No focal deficit present.     Mental Status: She is alert and oriented to person, place, and time.        Assessment & Plan:   Hypertension   Her blood pressure is controlled.  OSA (obstructive sleep apnea)   She uses CPAP at nighttime.  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  She will continue to watch her diet.  Dyslipidemia  associated with type 2 diabetes mellitus (HCC)   Her diabetes and cholesterol are well controlled.  Will continue to monitor.  Neuropathy   She will further decrease the dose of gabapentin  to 300 mg twice a day and monitor.  Chronic right-sided low back pain with right-sided sciatica   She will sign a narcotic contract and I will send prescription of hydrocodone .    Return in about 1 month (around 07/27/2024).   Roetta Dare, MD

## 2024-06-26 NOTE — Assessment & Plan Note (Signed)
 She uses CPAP at nighttime.

## 2024-06-26 NOTE — Assessment & Plan Note (Signed)
 She will further decrease the dose of gabapentin  to 300 mg twice a day and monitor.

## 2024-07-25 ENCOUNTER — Other Ambulatory Visit: Payer: Self-pay

## 2024-07-26 ENCOUNTER — Ambulatory Visit: Admitting: Internal Medicine

## 2024-08-07 ENCOUNTER — Other Ambulatory Visit: Payer: Self-pay

## 2024-08-11 ENCOUNTER — Encounter: Payer: Self-pay | Admitting: Internal Medicine

## 2024-08-11 ENCOUNTER — Ambulatory Visit: Admitting: Internal Medicine

## 2024-08-11 VITALS — BP 138/80 | HR 98 | Temp 100.0°F | Resp 18 | Ht 60.0 in | Wt 215.0 lb

## 2024-08-11 DIAGNOSIS — G8929 Other chronic pain: Secondary | ICD-10-CM

## 2024-08-11 DIAGNOSIS — M5441 Lumbago with sciatica, right side: Secondary | ICD-10-CM

## 2024-08-11 LAB — POCT URINE DRUG SCREEN
Methylenedioxyamphetamine: NOT DETECTED — NL
POC Amphetamine UR: NOT DETECTED
POC BENZODIAZEPINES UR: NOT DETECTED
POC Barbiturate UR: NOT DETECTED
POC Cocaine UR: NOT DETECTED
POC DRUG SCREEN OXIDANTS URINE: NORMAL
POC Ecstasy UR: NOT DETECTED
POC Marijuana UR: POSITIVE — AB
POC Methadone UR: NOT DETECTED
POC Methamphetamine UR: NOT DETECTED
POC Opiate Ur: NOT DETECTED
POC Oxycodone UR: NOT DETECTED
POC PH URINE: NORMAL
POC PHENCYCLIDINE UR: NOT DETECTED
POC SPECIFIC GRAVITY URINE: NORMAL
POC TRICYCLICS UR: NOT DETECTED
URINE TEMPERATURE: 92.7 [degF] (ref 90.0–100.0)

## 2024-08-11 MED ORDER — HYDROCODONE-ACETAMINOPHEN 7.5-325 MG PO TABS: 1.0000 | ORAL_TABLET | Freq: Three times a day (TID) | ORAL | 0 refills | Status: DC | PRN

## 2024-08-11 MED ORDER — HYDROCODONE-ACETAMINOPHEN 7.5-325 MG PO TABS: 1.0000 | ORAL_TABLET | Freq: Three times a day (TID) | ORAL | 0 refills | Status: AC | PRN

## 2024-08-11 NOTE — Addendum Note (Signed)
 Addended by: Nefi Musich on: 08/11/2024 03:51 PM   Modules accepted: Orders

## 2024-08-11 NOTE — Assessment & Plan Note (Signed)
 She has lost 3 pounds since last visit.  Her BMI is 41.

## 2024-08-11 NOTE — Assessment & Plan Note (Signed)
 She ran out of her pain medication 5 days ago.  She has been using marijuana Gummies.  I have reviewed her urine drug screen.  I will send refill of hydrocodone .

## 2024-08-11 NOTE — Progress Notes (Signed)
 "  Office Visit  Subjective   Patient ID: Danniell Rotundo   DOB: Oct 16, 1960   Age: 64 y.o.   MRN: [id number]   Chief Complaint Chief Complaint  Patient presents with   Follow-up    Patient here for follow up     History of Present Illness 64 years old female who is here for follow-up.  She could not come for follow-up appointment because she got sick she was treated for COVID infection by pulmonologist.  She says that she ran out of her pain medication 5 days ago.  And she was hurting so much that her daughter brought her Gummies and she has been using that.  She says that since she has COVID she hurt all over and back pain is also worse.  She has been using hydrocodone  2-3 times a day as needed when the pain is really bad.  She describes this pain as worse, it does radiate to right leg.   She has lost 3 pound and her weight today is 215 pounds with BMI of 41.      She also has a history of depression and she takes venlafaxine 75 mg daily.     Past Medical History Past Medical History:  Diagnosis Date   Acute on chronic respiratory failure (HCC) 11/30/2020   Acute respiratory failure with hypoxia (HCC) 10/07/2020   Allergic rhinitis    Benign essential hypertension 01/07/2022   Bronchitis    Chronic pain disorder 01/28/2021   Chronic use of opiate for therapeutic purpose 03/04/2022   Last Assessment & Plan:    Patient and I have discussed the hazardous effects of continued opiate pain medication usage. Risks and benefits of above medications including but not limited to possibility of hyperalgesia, respiratory depression, sedation, and even death were discussed with the patient who expressed an understanding. Patient is willing to assume these health risks as mentioned above    COPD exacerbation (HCC) 11/30/2020   Coronary artery calcification seen on CT scan 03/04/2023   DDD (degenerative disc disease), lumbar 02/03/2022   Last Assessment & Plan:    See spondylosis plan.     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  Patient's BMI 46.10 Encouraged healthy eating habits and routine low-impact cardiovascular exercises as tolerated.  Patient reports recent dietary changes and increased low-impact cardiovascular exercises which has resulted in an approximate 20-25    Neuropathy 01/07/2022   OSA (obstructive sleep apnea)    Other fatigue 06/05/2021   Pain medication agreement 02/03/2022   Last Assessment & Plan:    Agreement reviewed and discussed, signed and placed in patient's chart today.       UDS to be collected in compliance with clinic policies and procedures.        Patient did not display any signs of abuse or diversion and has been checked on the [place]  Controlled Substance website.     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      Review of previous lumbar spine plain films completed in 2021 detailed in HPI.  She has been working  on physician directed exercises including seated lower back stretches, upper trunk rot     Allergies Allergies[1]   Review of Systems Review of Systems  Constitutional:  Positive for malaise/fatigue.  Respiratory: Negative.    Cardiovascular: Negative.   Musculoskeletal:  Positive for back pain and joint pain.       Objective:    Vitals BP 138/80   Pulse 98   Temp 100 F (37.8 C)   Resp 18   Ht 5' (1.524 m)   Wt 215 lb (97.5 kg)   SpO2 96%   BMI 41.99 kg/m    Physical Examination Physical Exam Constitutional:      Appearance: Normal appearance. She is obese.  Cardiovascular:     Rate and Rhythm: Normal rate and regular rhythm.     Heart sounds: Normal heart sounds.  Pulmonary:     Effort: Pulmonary effort is normal.     Breath sounds: Normal breath sounds.  Neurological:     General: No focal deficit present.     Mental Status: She is alert and oriented to person, place, and time.        Assessment & Plan:   Chronic right-sided low back pain with right-sided sciatica She ran out of her pain medication 5 days ago.  She has been using marijuana Gummies.  I have reviewed her urine drug screen.  I will send refill of hydrocodone .  Morbid obesity (HCC) She has lost 3 pounds since last visit.  Her BMI is 41.    Return in about 1 month (around 09/11/2024).   Quinten Allerton, MD      [1]  Allergies Allergen Reactions   Simvastatin    Celexa [Citalopram]    "

## 2024-08-17 ENCOUNTER — Other Ambulatory Visit: Payer: Self-pay | Admitting: Internal Medicine

## 2024-09-08 ENCOUNTER — Ambulatory Visit: Admitting: Internal Medicine
